# Patient Record
Sex: Female | Born: 1975
Health system: Southern US, Community
[De-identification: ages and names within clinical notes are randomized; demographics above are authoritative.]

## PROBLEM LIST (undated history)

## (undated) DIAGNOSIS — M1990 Unspecified osteoarthritis, unspecified site: Secondary | ICD-10-CM

## (undated) DIAGNOSIS — Z973 Presence of spectacles and contact lenses: Secondary | ICD-10-CM

## (undated) DIAGNOSIS — T753XXA Motion sickness, initial encounter: Secondary | ICD-10-CM

---

## 2013-04-17 ENCOUNTER — Ambulatory Visit: Payer: Self-pay

## 2013-04-22 ENCOUNTER — Encounter: Payer: Self-pay | Admitting: General Surgery

## 2013-04-22 ENCOUNTER — Ambulatory Visit (INDEPENDENT_AMBULATORY_CARE_PROVIDER_SITE_OTHER): Payer: 59 | Admitting: General Surgery

## 2013-04-22 VITALS — BP 118/70 | HR 76 | Resp 12 | Ht 67.0 in | Wt 159.0 lb

## 2013-04-22 DIAGNOSIS — N63 Unspecified lump in unspecified breast: Secondary | ICD-10-CM

## 2013-04-22 NOTE — Progress Notes (Signed)
Patient ID: Jasmine Freeman [name], female   DOB: 1975-05-15, 38 y.o.   MRN: [id number]  Chief Complaint  Patient presents with  . Other    New Pt left breast mass 12 o'clock    HPI Jasmine Freeman is a 38 y.o. female who presents for a breast evaluation. The most recent mammogram was done on 04/17/13 as well as a left breast ultrasound. Patient does perform regular self breast checks and gets regular mammograms done.  She denies any problems with the breasts at this time. The patient states she had a left breast cyst in 1999 but was left alone at that time. The patient states she went for a pre-conception visit and her physician found a left breast mass.   The patient had no difficulty nursing her first child 2.5 years ago. No episodes of mastitis.   HPI  History reviewed. No pertinent past medical history.  History reviewed. No pertinent past surgical history.  History reviewed. No pertinent family history.  Social History History  Substance Use Topics  . Smoking status: Never Smoker   . Smokeless tobacco: Never Used  . Alcohol Use: No    No Known Allergies  Current Outpatient Prescriptions  Medication Sig Dispense Refill  . Prenatal Vit-Fe Sulfate-FA (PRENATAL VITAMIN PO) Take 1 tablet by mouth daily.       No current facility-administered medications for this visit.    Review of Systems Review of Systems  Constitutional: Negative.   Respiratory: Negative.   Cardiovascular: Negative.     Blood pressure 118/70, pulse 76, resp. rate 12, height 5\' 7"  (1.702 m), weight 159 lb (72.122 kg).  Physical Exam Physical Exam  Constitutional: She is oriented to person, place, and time. She appears well-developed and well-nourished.  Neck: Neck supple. No thyromegaly present.  Cardiovascular: Normal rate, regular rhythm and normal heart sounds.   No murmur heard. Pulmonary/Chest: Effort normal and breath sounds normal. Right breast exhibits no inverted nipple,  no mass, no nipple discharge, no skin change and no tenderness. Left breast exhibits no inverted nipple, no mass, no nipple discharge, no skin change and no tenderness.    Lymphadenopathy:    She has no cervical adenopathy.    She has no axillary adenopathy.  Neurological: She is alert and oriented to person, place, and time.  Skin: Skin is warm and dry.    Data Reviewed Abdominal ultrasound dated April 17, 2013 was reviewed. 5 mm cyst in the 12:00 position. Small cluster of cysts in the 10:00 position. No cystic or solid lesions of concern. BI-RAD-2.  Assessment    Prominent breast lobule, no evidence of malignancy or dominant mass requiring biopsy. Her     Plan    Monthly self-examination was encouraged. The patient was asked to report any changes on her self-exam.        Earline MayotteByrnett, Polo Mcmartin W 04/23/2013, 8:53 PM

## 2013-04-22 NOTE — Patient Instructions (Addendum)
Patient to return as needed. Patient to continue self breast checks on a monthly basis.

## 2013-06-27 ENCOUNTER — Other Ambulatory Visit: Payer: Self-pay | Admitting: Obstetrics and Gynecology

## 2013-06-27 LAB — HCG, QUANTITATIVE, PREGNANCY: BETA HCG, QUANT.: 69559 m[IU]/mL — AB

## 2013-06-29 ENCOUNTER — Other Ambulatory Visit: Payer: Self-pay | Admitting: Obstetrics and Gynecology

## 2013-06-29 LAB — HCG, QUANTITATIVE, PREGNANCY: BETA HCG, QUANT.: 89207 m[IU]/mL — AB

## 2014-01-11 ENCOUNTER — Inpatient Hospital Stay: Payer: Self-pay | Admitting: Obstetrics and Gynecology

## 2014-01-11 LAB — CBC WITH DIFFERENTIAL/PLATELET
BASOS PCT: 0.5 %
Basophil #: 0.1 10*3/uL (ref 0.0–0.1)
EOS PCT: 0.9 %
Eosinophil #: 0.2 10*3/uL (ref 0.0–0.7)
HCT: 37.5 % (ref 35.0–47.0)
HGB: 12.7 g/dL (ref 12.0–16.0)
LYMPHS PCT: 23.2 %
Lymphocyte #: 3.9 10*3/uL — ABNORMAL HIGH (ref 1.0–3.6)
MCH: 32.6 pg (ref 26.0–34.0)
MCHC: 33.8 g/dL (ref 32.0–36.0)
MCV: 96 fL (ref 80–100)
MONOS PCT: 6.2 %
Monocyte #: 1 x10 3/mm — ABNORMAL HIGH (ref 0.2–0.9)
NEUTROS ABS: 11.7 10*3/uL — AB (ref 1.4–6.5)
Neutrophil %: 69.2 %
PLATELETS: 223 10*3/uL (ref 150–440)
RBC: 3.89 10*6/uL (ref 3.80–5.20)
RDW: 13.5 % (ref 11.5–14.5)
WBC: 16.9 10*3/uL — ABNORMAL HIGH (ref 3.6–11.0)

## 2014-01-14 LAB — BETA STREP CULTURE(ARMC)

## 2014-01-19 ENCOUNTER — Encounter: Payer: Self-pay | Admitting: General Surgery

## 2014-01-24 ENCOUNTER — Observation Stay: Payer: Self-pay | Admitting: Obstetrics and Gynecology

## 2014-01-30 ENCOUNTER — Inpatient Hospital Stay: Payer: Self-pay | Admitting: Obstetrics and Gynecology

## 2014-01-30 LAB — CBC WITH DIFFERENTIAL/PLATELET
BASOS ABS: 0 10*3/uL (ref 0.0–0.1)
Basophil %: 0.2 %
Eosinophil #: 0.1 10*3/uL (ref 0.0–0.7)
Eosinophil %: 0.7 %
HCT: 35.6 % (ref 35.0–47.0)
HGB: 12.2 g/dL (ref 12.0–16.0)
Lymphocyte #: 2.6 10*3/uL (ref 1.0–3.6)
Lymphocyte %: 17.1 %
MCH: 33.1 pg (ref 26.0–34.0)
MCHC: 34.2 g/dL (ref 32.0–36.0)
MCV: 97 fL (ref 80–100)
MONO ABS: 1.2 x10 3/mm — AB (ref 0.2–0.9)
MONOS PCT: 7.8 %
NEUTROS PCT: 74.2 %
Neutrophil #: 11.4 10*3/uL — ABNORMAL HIGH (ref 1.4–6.5)
PLATELETS: 199 10*3/uL (ref 150–440)
RBC: 3.68 10*6/uL — AB (ref 3.80–5.20)
RDW: 13.1 % (ref 11.5–14.5)
WBC: 15.4 10*3/uL — ABNORMAL HIGH (ref 3.6–11.0)

## 2014-02-01 LAB — HEMATOCRIT: HCT: 31.5 % — ABNORMAL LOW (ref 35.0–47.0)

## 2014-07-28 NOTE — H&P (Signed)
L&D Evaluation:  History:  HPI 39 yo G2P0101 at 845w0d by D=10 wk US derived EDC of 02/14/2014 presenting with contractions starting at 18:00 this afternoon, increasing intensity, as well as associated pelvic pressure. +FM, no LOF, no VB.  The patient prenatal course is noteable for a prior preterm birth at 3536 weeks.  She has been receiving weekly Mekena injections this pregnancy up to 35 weeks.   Presents with contractions   Patient's Medical History No Chronic Illness   Patient's Surgical History none   Medications Pre Natal Vitamins   Allergies PCN   Social History none   Family History Non-Contributory   ROS:  ROS All systems were reviewed.  HEENT, CNS, GI, GU, Respiratory, CV, Renal and Musculoskeletal systems were found to be normal.   Exam:  Vital Signs stable   Urine Protein not completed   General no apparent distress   Mental Status clear   Chest no increased work of breathing   Abdomen gravid, non-tender   Estimated Fetal Weight Average for gestational age   Fetal Position vtx   Back no CVAT   Edema no edema   Pelvic no external lesions, 4.5/70/-2 vtx   Mebranes Intact   FHT normal rate with no decels, 135, moderte, positive accels, no decels   Ucx regular, q314min   Skin no lesions   Lymph no lymphadenopathy   Impression:  Impression 39 yo G2P0101 at 10445w0d presenting with contractions   Plan:  Plan EFM/NST, monitor contractions and for cervical change   Comments 1) R/O term labor - 4.5/70/-2 recheck in 2-hrs  2) Fetus - category I tracing, reactive - 18lbs weight gain this pregnancy - 1-hr OGTT 139 - Pelvis tested to 7lbs 11oz  - EFW 01/19/2014 at 3453w2d 6lbs 8oz c/w 54.2%ile  3) PNL A neg / ABSC neg / RI / VZI / HBsAg neg / HIV neg / RPR NR / Negative informaseq / CF negative / 1-hr 139 at 28 weeks / GBS negatie 01/11/14 / pap 04/16/2013 neg/neg - received rhogam 11/25/2013  4) TDAP received 12/10/2013, Influenza vaccination received  01/02/2014  5) Breast feeding / undecided on contraception  6) Disposition - pending cervical recheck   Electronic Signatures: Lorrene ReidStaebler, Enna Warwick M (MD)  (Signed [phone]-Nov-15 21:42)  Authored: L&D Evaluation   Last Updated: 07-Nov-15 21:42 by Lorrene ReidStaebler, Eevee Borbon M (MD)

## 2014-07-28 NOTE — H&P (Signed)
L&D Evaluation:  History:  HPI 39 yo G2P0101 at 3564w6d by D=10 wk US derived EDC of 02/14/2014 presenting with contractions starting this evening, increasing intensity, as well as associated pelvic pressure. Last check in clinic was 4/70/-2 on 01/29/14 +FM, no LOF, no VB.  The patient prenatal course is noteable for a prior preterm birth at 2436 weeks.  She has been receiving weekly Mekena injections this pregnancy up to 35 weeks.   Presents with contractions   Patient's Medical History No Chronic Illness   Patient's Surgical History none   Medications Pre Natal Vitamins   Allergies PCN   Social History none   Family History Non-Contributory   ROS:  ROS All systems were reviewed.  HEENT, CNS, GI, GU, Respiratory, CV, Renal and Musculoskeletal systems were found to be normal.   Exam:  Vital Signs stable   Urine Protein not completed   General no apparent distress   Mental Status clear   Chest no increased work of breathing   Abdomen gravid, non-tender   Estimated Fetal Weight Average for gestational age   Fetal Position vtx   Back no CVAT   Edema no edema   Pelvic no external lesions, 6/80/-2 vtx   Mebranes Intact   FHT normal rate with no decels   Ucx regular, q404min   Skin no lesions   Lymph no lymphadenopathy   Impression:  Impression 39 yo G2P0101 at 4369w0d presenting with contractions   Plan:  Plan EFM/NST, monitor contractions and for cervical change   Comments 1) Term labor - 6cm on exam, active labor. will send labs have patient get epidural and then AROM if fails to make change     2) Fetus - category I tracing, reactive - 18lbs weight gain this pregnancy - 1-hr OGTT 139 - Pelvis tested to 7lbs 11oz  - EFW 01/19/2014 at 6160w2d 6lbs 8oz c/w 54.2%ile  3) PNL A neg / ABSC neg / RI / VZI / HBsAg neg / HIV neg / RPR NR / Negative informaseq / CF negative / 1-hr 139 at 28 weeks / GBS negatie 01/11/14 / pap 04/16/2013 neg/neg - received rhogam  11/25/2013  4) TDAP received 12/10/2013, Influenza vaccination received 01/02/2014  5) Breast feeding / undecided on contraception  6) Disposition - pending delivery anticipated TSVD   Electronic Signatures: Lorrene ReidStaebler, Dmarcus Decicco M (MD)  (Signed 13-Nov-15 18:57)  Authored: L&D Evaluation   Last Updated: 13-Nov-15 18:57 by Lorrene ReidStaebler, Jaqlyn Gruenhagen M (MD)

## 2014-07-28 NOTE — H&P (Signed)
L&D Evaluation:  History:  HPI 39 yo G2P0101 at 3921w1d by D=10 wk US derived EDC of 02/14/2014 presenting with contractions starting late this afternoon.  She was noted to be 1cm at 16:30, then made change to 2cm at 1800.  Contraction increasing in intensity, q724min.  +FM, no LOF, no VB.  The patient prenatal course is noteable for a prior preterm birth at 1236 weeks.  She has been receiving weekly Mekena injections this pregnancy.   Presents with contractions   Patient's Medical History No Chronic Illness   Patient's Surgical History none   Medications Pre Natal Vitamins   Allergies NKDA   Social History none   Family History Non-Contributory   ROS:  ROS All systems were reviewed.  HEENT, CNS, GI, GU, Respiratory, CV, Renal and Musculoskeletal systems were found to be normal.   Exam:  Vital Signs stable   Urine Protein not completed   General no apparent distress   Mental Status clear   Chest no increased work of breathing   Abdomen gravid, non-tender   Estimated Fetal Weight Average for gestational age   Fetal Position vtx suture palpated on bimanual exam   Back no CVAT   Edema no edema   Pelvic no external lesions, 3/50/-3 vtx   Mebranes Intact   FHT normal rate with no decels, 135, moderte, positive accels, no decels   Ucx regular, q614min   Skin no lesions   Lymph no lymphadenopathy   Impression:  Impression 39 yo G2P0101 at 3221w1d presenting in possible preterm labor   Plan:  Plan EFM/NST, monitor contractions and for cervical change   Comments 1) Preterm labor - history of prior 36 week delivery and cervical change in the last 3 hrs. - Terbutaline - start IV fluids - start ampicillin  2) Fetus - category I tracing, reactive - 18lbs weight gain this pregnancy - 1-hr OGTT - Pelvis tested to 7lbs 11oz   3) PNL A neg / ABSC neg / RI / VZI / HBsAg neg / HIV neg / RPR NR / Negative informaseq / CF negative / 1-hr 139 at 28 weeks / GBS unknown /  pap 04/16/2013 neg/neg - received rhogam 11/25/2013  4) TDAP received 12/10/2013, Influenza vaccination received 01/02/2014  5) Breast feeding / undecided on contraception  6) Disposition - pending arrest in in preterm labor or delivery   Electronic Signatures: Lorrene ReidStaebler, Shahzad Thomann M (MD)  (Signed 25-Oct-15 20:09)  Authored: L&D Evaluation   Last Updated: 25-Oct-15 20:09 by Lorrene ReidStaebler, Amahia Madonia M (MD)

## 2015-03-21 HISTORY — PX: BREAST BIOPSY: SHX20

## 2016-01-04 DIAGNOSIS — Z13220 Encounter for screening for lipoid disorders: Secondary | ICD-10-CM | POA: Diagnosis not present

## 2016-01-04 DIAGNOSIS — Z131 Encounter for screening for diabetes mellitus: Secondary | ICD-10-CM | POA: Diagnosis not present

## 2016-01-04 DIAGNOSIS — Z01419 Encounter for gynecological examination (general) (routine) without abnormal findings: Secondary | ICD-10-CM | POA: Diagnosis not present

## 2016-01-04 DIAGNOSIS — Z1329 Encounter for screening for other suspected endocrine disorder: Secondary | ICD-10-CM | POA: Diagnosis not present

## 2016-03-15 DIAGNOSIS — Z7189 Other specified counseling: Secondary | ICD-10-CM | POA: Insufficient documentation

## 2016-03-15 DIAGNOSIS — F418 Other specified anxiety disorders: Secondary | ICD-10-CM | POA: Insufficient documentation

## 2016-03-15 DIAGNOSIS — Z7185 Encounter for immunization safety counseling: Secondary | ICD-10-CM | POA: Insufficient documentation

## 2016-03-31 DIAGNOSIS — Z23 Encounter for immunization: Secondary | ICD-10-CM | POA: Diagnosis not present

## 2016-05-01 DIAGNOSIS — F418 Other specified anxiety disorders: Secondary | ICD-10-CM | POA: Diagnosis not present

## 2016-08-24 DIAGNOSIS — H5203 Hypermetropia, bilateral: Secondary | ICD-10-CM | POA: Diagnosis not present

## 2016-09-18 ENCOUNTER — Other Ambulatory Visit: Payer: Self-pay | Admitting: Family Medicine

## 2016-09-18 DIAGNOSIS — R1011 Right upper quadrant pain: Secondary | ICD-10-CM | POA: Diagnosis not present

## 2016-09-27 ENCOUNTER — Ambulatory Visit
Admission: RE | Admit: 2016-09-27 | Discharge: 2016-09-27 | Disposition: A | Discharge status: Home / Self Care | Payer: 59 | Source: Ambulatory Visit | Attending: Family Medicine | Admitting: Family Medicine

## 2016-09-27 DIAGNOSIS — R1011 Right upper quadrant pain: Secondary | ICD-10-CM | POA: Diagnosis not present

## 2016-09-29 ENCOUNTER — Other Ambulatory Visit: Payer: Self-pay | Admitting: Obstetrics and Gynecology

## 2016-09-29 DIAGNOSIS — R1084 Generalized abdominal pain: Secondary | ICD-10-CM

## 2016-11-26 ENCOUNTER — Other Ambulatory Visit: Payer: Self-pay | Admitting: Obstetrics and Gynecology

## 2016-11-26 MED ORDER — ONDANSETRON 8 MG PO TBDP: 8.0000 mg | ORAL_TABLET | Freq: Three times a day (TID) | ORAL | 0 refills | Status: DC | PRN

## 2016-11-26 MED ORDER — PROMETHAZINE HCL 25 MG PO TABS: 25.0000 mg | ORAL_TABLET | Freq: Four times a day (QID) | ORAL | 2 refills | Status: DC | PRN

## 2016-11-28 ENCOUNTER — Other Ambulatory Visit: Payer: Self-pay

## 2016-11-29 ENCOUNTER — Encounter: Payer: Self-pay | Admitting: Gastroenterology

## 2016-11-29 ENCOUNTER — Ambulatory Visit (INDEPENDENT_AMBULATORY_CARE_PROVIDER_SITE_OTHER): Payer: 59 | Admitting: Gastroenterology

## 2016-11-29 ENCOUNTER — Encounter (INDEPENDENT_AMBULATORY_CARE_PROVIDER_SITE_OTHER): Payer: Self-pay

## 2016-11-29 VITALS — BP 117/65 | HR 70 | Temp 99.1°F | Ht 67.0 in | Wt 164.0 lb

## 2016-11-29 DIAGNOSIS — R112 Nausea with vomiting, unspecified: Secondary | ICD-10-CM

## 2016-11-29 NOTE — Patient Instructions (Signed)
You are scheduled a HIDA scan at [facility] on Friday, Sept 28th @ 11:30am. Please arrive at 11:15am and check in at the medical mall registration desk. You cannot have anything to eat or drink 8 hours prior.   If you need to reschedule this appointment for any reason, please contact central scheduling at [phone].

## 2016-11-29 NOTE — Progress Notes (Signed)
Gastroenterology Consultation  Referring Provider:     Lindwood Coke, MD Primary Care Physician:  Jasmine Austria, MD Primary Gastroenterologist:  Dr. Servando Snare     Reason for Consultation:     Nausea        HPI:   Jasmine Freeman is a 41 y.o. y/o female referred for consultation & management of Nausea by Dr. Vena Austria, MD.  This patient comes today with a report of a few months of abdominal pain and nausea. The patient states her abdominal pain was in the mid abdomen and she reports that the pain would double her over. The patient had a daughter with a tapeworm that she believes was from her form. The patient raises takes. The patient is not having any diarrhea at the present time and states that the abdominal pain has been gone recently but she continues to have nausea. The patient reports the nausea to be worse with greasy or fatty foods. The patient has also had an elevated ALT which has not been worked up. She did not have any sign of fatty liver on her ultrasound. She also reports that the nausea was made worse with riding a tractor. She has episodes of vomiting typically once a week but has nausea chronically. There is no report of any unexplained weight loss and the patient actually reports she has gained weight. There is no diarrhea or constipation. She also reports that she has not been tried on any medication for her nausea or abdominal pain.  History reviewed. No pertinent past medical history.  History reviewed. No pertinent surgical history.  Prior to Admission medications   Medication Sig Start Date End Date Taking? Authorizing Provider  ondansetron (ZOFRAN-ODT) 8 MG disintegrating tablet Take 1 tablet (8 mg total) by mouth every 8 (eight) hours as needed for nausea. 11/26/16  Yes Jasmine Austria, MD  Prenatal Vit-Fe Sulfate-FA (PRENATAL VITAMIN PO) Take 1 tablet by mouth daily.    [provider]  promethazine (PHENERGAN) 25 MG tablet Take 1 tablet (25 mg  total) by mouth every 6 (six) hours as needed for nausea or vomiting. Patient not taking: Reported on 11/29/2016 11/26/16   Jasmine Austria, MD  tretinoin (RETIN-A) 0.025 % gel  09/04/12   [provider]    History reviewed. No pertinent family history.   Social History  Substance Use Topics  . Smoking status: Never Smoker  . Smokeless tobacco: Never Used  . Alcohol use No    Allergies as of 11/29/2016  . (No Known Allergies)    Review of Systems:    All systems reviewed and negative except where noted in HPI.   Physical Exam:  BP 117/65   Pulse 70   Temp 99.1 F (37.3 C) (Oral)   Ht  (1.702 m)   Wt 164 lb (74.4 kg)   BMI 25.69 kg/m  No LMP recorded. Psych:  Alert and cooperative. Normal mood and affect. General:   Alert,  Well-developed, well-nourished, pleasant and cooperative in NAD Head:  Normocephalic and atraumatic. Eyes:  Sclera clear, no icterus.   Conjunctiva pink. Ears:  Normal auditory acuity. Nose:  No deformity, discharge, or lesions. Mouth:  No deformity or lesions,oropharynx pink & moist. Neck:  Supple; no masses or thyromegaly. Lungs:  Respirations even and unlabored.  Clear throughout to auscultation.   No wheezes, crackles, or rhonchi. No acute distress. Heart:  Regular rate and rhythm; no murmurs, clicks, rubs, or gallops. Abdomen:  Normal bowel sounds.  No  bruits.  Soft, non-tender and non-distended without masses, hepatosplenomegaly or hernias noted.  No guarding or rebound tenderness.  Negative Carnett sign.   Rectal:  Deferred.  Msk:  Symmetrical without gross deformities.  Good, equal movement & strength bilaterally. Pulses:  Normal pulses noted. Extremities:  No clubbing or edema.  No cyanosis. Neurologic:  Alert and oriented x3;  grossly normal neurologically. Skin:  Intact without significant lesions or rashes.  No jaundice. Lymph Nodes:  No significant cervical adenopathy. Psych:  Alert and cooperative. Normal mood and  affect.  Imaging Studies: No results found.  Assessment and Plan:   Lyrik A Jean Jasmine Freeman is a 41 y.o. y/o female who comes in today with a history of solid pain that has resolved. The patient continues to have nausea. There is no report of any change in bowel habits but there is a history of a contact with her daughter who had a tapeworm. The patient reports that her symptoms are worse with greasy or fatty foods particularly poor. The patient had an ultrasound but will be set up for a gallbladder emptying study. She will also have her labs checked for possible causes of her abnormal liver enzymes. The patient has also been given samples of Dexilant to see if her nausea and vomiting is acid related. The patient will be contacted with the results of her labs. She has been explained the plan and agrees with it.  Jasmine Miniumarren Moris Ratchford, MD. Clementeen GrahamFACG   Note: This dictation was prepared with Dragon dictation along with smaller phrase technology. Any transcriptional errors that result from this process are unintentional.

## 2016-11-30 LAB — IRON AND TIBC
IRON SATURATION: 44 % (ref 15–55)
Iron: 130 ug/dL (ref 27–159)
Total Iron Binding Capacity: 297 ug/dL (ref 250–450)
UIBC: 167 ug/dL (ref 131–425)

## 2016-11-30 LAB — HEPATIC FUNCTION PANEL
ALBUMIN: 5.2 g/dL (ref 3.5–5.5)
ALT: 43 IU/L — ABNORMAL HIGH (ref 0–32)
AST: 30 IU/L (ref 0–40)
Alkaline Phosphatase: 46 IU/L (ref 39–117)
BILIRUBIN TOTAL: 0.7 mg/dL (ref 0.0–1.2)
Bilirubin, Direct: 0.16 mg/dL (ref 0.00–0.40)
TOTAL PROTEIN: 7.3 g/dL (ref 6.0–8.5)

## 2016-11-30 LAB — ANTI-SMOOTH MUSCLE ANTIBODY, IGG: SMOOTH MUSCLE AB: 9 U (ref 0–19)

## 2016-11-30 LAB — ANA: ANA: NEGATIVE

## 2016-11-30 LAB — ALPHA-1-ANTITRYPSIN: A1 ANTITRYPSIN: 140 mg/dL (ref 90–200)

## 2016-11-30 LAB — HEPATITIS B SURFACE ANTIGEN: Hepatitis B Surface Ag: NEGATIVE

## 2016-11-30 LAB — MITOCHONDRIAL ANTIBODIES: MITOCHONDRIAL AB: 3.7 U (ref 0.0–20.0)

## 2016-11-30 LAB — HEPATITIS A ANTIBODY, TOTAL: HEP A TOTAL AB: NEGATIVE

## 2016-11-30 LAB — HEPATITIS B SURFACE ANTIBODY,QUALITATIVE: Hep B Surface Ab, Qual: REACTIVE

## 2016-11-30 LAB — CERULOPLASMIN: Ceruloplasmin: 22 mg/dL (ref 19.0–39.0)

## 2016-11-30 LAB — FERRITIN: Ferritin: 97 ng/mL (ref 15–150)

## 2016-12-01 ENCOUNTER — Other Ambulatory Visit: Payer: Self-pay

## 2016-12-01 ENCOUNTER — Telehealth: Payer: Self-pay

## 2016-12-01 NOTE — Telephone Encounter (Signed)
-----   Message from Midge Minium, MD sent at 12/01/2016 10:10 AM EDT ----- Left this patient know that her liver tests were all negative except the continued increased ALT.  Please add a GGT to this patient's blood work.

## 2016-12-01 NOTE — Telephone Encounter (Signed)
Left vm with lab results and Dr. Annabell Sabal request for the additional GGT lab. Will contact pt once we receive result of this lab.

## 2016-12-04 ENCOUNTER — Other Ambulatory Visit: Payer: Self-pay

## 2016-12-04 LAB — GAMMA GT: GGT: 70 IU/L — AB (ref 0–60)

## 2016-12-04 LAB — SPECIMEN STATUS REPORT

## 2016-12-06 ENCOUNTER — Telehealth: Payer: Self-pay

## 2016-12-06 NOTE — Telephone Encounter (Signed)
-----   Message from Midge Minium, MD sent at 12/06/2016 10:24 AM EDT ----- With the patient know that her GGT was slightly high indicating that her increased ALT is likely from her liver.  All the other labs do not show any worrisome features of her liver.  We are still waiting for the stool samples. Her blood tests also showed that she was immune to hepatitis the but not to hepatitis A And she may want to get a vaccination for hepatitis A.

## 2016-12-06 NOTE — Telephone Encounter (Signed)
LVM for pt to return my call.

## 2016-12-07 DIAGNOSIS — R112 Nausea with vomiting, unspecified: Secondary | ICD-10-CM | POA: Diagnosis not present

## 2016-12-07 NOTE — Telephone Encounter (Signed)
LVM again for pt to return my call.  

## 2016-12-07 NOTE — Telephone Encounter (Signed)
Pt returned my call and we discussed results of her labs.

## 2016-12-08 ENCOUNTER — Encounter: Payer: Self-pay | Admitting: Emergency Medicine

## 2016-12-08 ENCOUNTER — Emergency Department: Payer: 59

## 2016-12-08 ENCOUNTER — Emergency Department
Admission: EM | Admit: 2016-12-08 | Discharge: 2016-12-08 | Disposition: A | Discharge status: Home / Self Care | Payer: 59 | Attending: Emergency Medicine | Admitting: Emergency Medicine

## 2016-12-08 DIAGNOSIS — R42 Dizziness and giddiness: Secondary | ICD-10-CM | POA: Insufficient documentation

## 2016-12-08 DIAGNOSIS — R1011 Right upper quadrant pain: Secondary | ICD-10-CM | POA: Diagnosis not present

## 2016-12-08 DIAGNOSIS — N83299 Other ovarian cyst, unspecified side: Secondary | ICD-10-CM | POA: Insufficient documentation

## 2016-12-08 DIAGNOSIS — Z79899 Other long term (current) drug therapy: Secondary | ICD-10-CM | POA: Insufficient documentation

## 2016-12-08 DIAGNOSIS — R109 Unspecified abdominal pain: Secondary | ICD-10-CM | POA: Diagnosis not present

## 2016-12-08 DIAGNOSIS — N83209 Unspecified ovarian cyst, unspecified side: Secondary | ICD-10-CM | POA: Diagnosis not present

## 2016-12-08 DIAGNOSIS — K5900 Constipation, unspecified: Secondary | ICD-10-CM | POA: Diagnosis not present

## 2016-12-08 DIAGNOSIS — H9319 Tinnitus, unspecified ear: Secondary | ICD-10-CM | POA: Insufficient documentation

## 2016-12-08 DIAGNOSIS — R1013 Epigastric pain: Secondary | ICD-10-CM | POA: Insufficient documentation

## 2016-12-08 DIAGNOSIS — Z975 Presence of (intrauterine) contraceptive device: Secondary | ICD-10-CM | POA: Insufficient documentation

## 2016-12-08 LAB — COMPREHENSIVE METABOLIC PANEL
ALBUMIN: 4.6 g/dL (ref 3.5–5.0)
ALK PHOS: 38 U/L (ref 38–126)
ALT: 40 U/L (ref 14–54)
ANION GAP: 9 (ref 5–15)
AST: 39 U/L (ref 15–41)
BILIRUBIN TOTAL: 1 mg/dL (ref 0.3–1.2)
BUN: 11 mg/dL (ref 6–20)
CO2: 23 mmol/L (ref 22–32)
Calcium: 9.3 mg/dL (ref 8.9–10.3)
Chloride: 106 mmol/L (ref 101–111)
Creatinine, Ser: 0.58 mg/dL (ref 0.44–1.00)
GFR calc Af Amer: >60 mL/min (ref >60)
GFR calc non Af Amer: >60 mL/min (ref >60)
GLUCOSE: 103 mg/dL — AB (ref 65–99)
POTASSIUM: 3.8 mmol/L (ref 3.5–5.1)
SODIUM: 138 mmol/L (ref 135–145)
TOTAL PROTEIN: 7.2 g/dL (ref 6.5–8.1)

## 2016-12-08 LAB — URINALYSIS, COMPLETE (UACMP) WITH MICROSCOPIC
Bilirubin Urine: NEGATIVE
Glucose, UA: NEGATIVE mg/dL
Ketones, ur: NEGATIVE mg/dL
Leukocytes, UA: NEGATIVE
Nitrite: NEGATIVE
PROTEIN: NEGATIVE mg/dL
SPECIFIC GRAVITY, URINE: 1.009 (ref 1.005–1.030)
pH: 7 (ref 5.0–8.0)

## 2016-12-08 LAB — CBC
HEMATOCRIT: 37.4 % (ref 35.0–47.0)
HEMOGLOBIN: 13.1 g/dL (ref 12.0–16.0)
MCH: 32.6 pg (ref 26.0–34.0)
MCHC: 35.1 g/dL (ref 32.0–36.0)
MCV: 93 fL (ref 80.0–100.0)
Platelets: 261 10*3/uL (ref 150–440)
RBC: 4.02 MIL/uL (ref 3.80–5.20)
RDW: 12.4 % (ref 11.5–14.5)
WBC: 10.1 10*3/uL (ref 3.6–11.0)

## 2016-12-08 LAB — HCG, QUANTITATIVE, PREGNANCY

## 2016-12-08 LAB — POCT PREGNANCY, URINE: PREG TEST UR: NEGATIVE

## 2016-12-08 LAB — LIPASE, BLOOD: Lipase: 27 U/L (ref 11–51)

## 2016-12-08 MED ORDER — DOCUSATE SODIUM 100 MG PO CAPS: 100.0000 mg | ORAL_CAPSULE | Freq: Every day | ORAL | 2 refills | Status: AC | PRN

## 2016-12-08 MED ORDER — IOPAMIDOL (ISOVUE-300) INJECTION 61%
100.0000 mL | Freq: Once | INTRAVENOUS | Status: AC | PRN
  Administered 2016-12-08: 100 mL via INTRAVENOUS

## 2016-12-08 MED ORDER — IOPAMIDOL (ISOVUE-300) INJECTION 61%
30.0000 mL | Freq: Once | INTRAVENOUS | Status: AC | PRN
Start: 2016-12-08 — End: 2016-12-08
  Administered 2016-12-08: 30 mL via ORAL

## 2016-12-08 MED ORDER — SODIUM CHLORIDE 0.9 % IV BOLUS (SEPSIS)
1000.0000 mL | Freq: Once | INTRAVENOUS | Status: AC
  Administered 2016-12-08: 1000 mL via INTRAVENOUS

## 2016-12-08 MED ORDER — POLYETHYLENE GLYCOL 3350 17 G PO PACK: 17.0000 g | PACK | Freq: Every day | ORAL | 0 refills | Status: DC

## 2016-12-08 NOTE — ED Triage Notes (Addendum)
Patient presents to the ED with severe left upper quadrant abdominal pain that has been intermittent x several months but patient states pain has never been as severe as it has been today.  Patient reports dizziness for the past several days and ringing in her ears.  Patient reports pain is slightly improved with pressure to the area.  Patient reports intermittent nausea but denies vomiting and diarrhea.  Patient states she has seen Dr. Smith Mince, the GI doctor and has been told her ALTs are elevated and her liver is inflamed.  Patient states pain has improved slightly at this time from how it was approx. 1 hour ago.  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

## 2016-12-08 NOTE — ED Notes (Signed)
Patient transported to CT 

## 2016-12-08 NOTE — ED Notes (Signed)
Pt reports left upper quad abd pain for several months with worsening pain today.  No fever. No back pain. Pt states no pain now.  No n/v/d  Pt drinking po contrast. Dr Jean Rosenthal with pt.  nsr on monitor.  Iv fluids infusing.  Pt unable to void at this time.

## 2016-12-08 NOTE — ED Notes (Signed)
Pt to xray

## 2016-12-08 NOTE — ED Provider Notes (Signed)
[facility] Emergency Department Provider Note  ____________________________________________   I have reviewed the triage vital signs and the nursing notes.   HISTORY  Chief Complaint Abdominal Pain and Dizziness    HPI Jasmine Freeman is a 41 y.o. female who presents today with right upper quadrant abdominal pain that was sharp, intense, crampy and lasted for about an hour or so after eating. Patient has had recurrent episodes of epigastric and right upper quadrant and left upper quadrant abdominal pain off and on for 6 months. She is being followed by a GI specialist for this. Her daughter did have ringworm and she has had numerous tick bites in the past and she is being worked up for worse with over and parasite and she dropped to the stool off this morning, she also is being worked up for tick mediated meats allergy as she has been unable to eat pork and other meat for the last several months because it seems to make her abdominal pain worse. The pain is crampy, food related, transitory. She is not had any vomiting or fever. The patient states that she has been not lost weight, she has normal bowel movements and she has been eating normally aside from a inability to take me. Fatty food also makes it worse. Patient has had a negative outpatient ultrasound and reassuring blood work done with one elevated transaminase that was only slightly elevated in July. Patient has had a extensive outpatient workup and isn't currently working with GI but as the pain lasted longer and seemed more intense today than normal she came in for further evaluation. Patient did have a last bowel movement this morning. In addition, patient states that she has had occasional "dizziness" which Tums sometimes seem to be vertiginous she is not suffering from an enema to have some earlier in his been going off and on for 6 months. Sometimes she has ringing in her left ear. That is not present at this  time, however that is also been a symptom that she has been having. Her primary care doctor is aware and working no symptoms up. She is not here for evaluation of those things she is here because of her abdominal pain. Patient also has an IUD, so she is unsure of her pregnancy status but she did last checked a pregnancy test to weeks ago and it was negative. Patient's husband is well known to [place] as one of our OB/GYN's and he is at bedside. Patient consents to have her husband present for history physical and exam Her pain is food related, she has no exertional symptoms or focal weakness or numbness. At the time of her arrival to the room she is pain-free. It is also noted that the patient's daughter had ascaris   History reviewed. No pertinent past medical history.  Patient Active Problem List   Diagnosis Date Noted  . Anxiety with depression 03/15/2016  . Vaccine counseling 03/15/2016  . Breast mass 04/22/2013    History reviewed. No pertinent surgical history.  Prior to Admission medications   Medication Sig Start Date End Date Taking? Authorizing Provider  ondansetron (ZOFRAN-ODT) 8 MG disintegrating tablet Take 1 tablet (8 mg total) by mouth every 8 (eight) hours as needed for nausea. 11/26/16   Vena Austria, MD  Prenatal Vit-Fe Sulfate-FA (PRENATAL VITAMIN PO) Take 1 tablet by mouth daily.    [provider]  promethazine (PHENERGAN) 25 MG tablet Take 1 tablet (25 mg total) by mouth every 6 (six)  hours as needed for nausea or vomiting. Patient not taking: Reported on 11/29/2016 11/26/16   Vena Austria, MD  tretinoin (RETIN-A) 0.025 % gel  09/04/12   [provider]    Allergies Patient has no known allergies.  No family history on file.  Social History Social History  Substance Use Topics  . Smoking status: Never Smoker  . Smokeless tobacco: Never Used  . Alcohol use No    Review of Systems Constitutional: No fever/chills Eyes: No visual  changes. ENT: No sore throat. No stiff neck no neck pain Cardiovascular: Denies chest pain. Respiratory: Denies shortness of breath. Gastrointestinal:   no vomiting.  No diarrhea.  No constipation. Genitourinary: Negative for dysuria. Musculoskeletal: Negative lower extremity swelling Skin: Negative for rash. Neurological: Negative for severe headaches, focal weakness or numbness.   ____________________________________________   PHYSICAL EXAM:  VITAL SIGNS: ED Triage Vitals [12/08/16 1619]  Enc Vitals Group     BP 136/76     Pulse Rate 80     Resp 18     Temp 98.1 F (36.7 C)     Temp Source Oral     SpO2 100 %     Weight 162 lb (73.5 kg)     Height  (1.702 m)     Head Circumference      Peak Flow      Pain Score      Pain Loc      Pain Edu?      Excl. in GC?     Constitutional: Alert and oriented. Well appearing and in no acute distress. Eyes: Conjunctivae are normal Head: Atraumatic HEENT: No congestion/rhinnorhea. Mucous membranes are moist.  Oropharynx non-erythematousTMs normal on the right, slightly occluded by cerumen on the left with normal TM visualized Neck:   Nontender with no meningismus, no masses, no stridor Cardiovascular: Normal rate, regular rhythm. Grossly normal heart sounds.  Good peripheral circulation. Respiratory: Normal respiratory effort.  No retractions. Lungs CTAB. Abdominal: Soft and nontender. No distention. No guarding no rebound Back:  There is no focal tenderness or step off.  there is no midline tenderness there are no lesions noted. there is no CVA tenderness Musculoskeletal: No lower extremity tenderness, no upper extremity tenderness. No joint effusions, no DVT signs strong distal pulses no edema Neurologic:  Cranial nerves II through XII are grossly intact 5 out of 5 strength bilateral upper and lower extremity. Finger to nose within normal limits heel to shin within normal limits, speech is normal with no word finding difficulty  or dysarthria, reflexes symmetric, pupils are equally round and reactive to light, there is no pronator drift, sensation is normal, vision is intact to confrontation, gait is deferred, there is no nystagmus, normal neurologic exam Skin:  Skin is warm, dry and intact. No rash noted. Psychiatric: Mood and affect are normal. Speech and behavior are normal.  ____________________________________________   LABS (all labs ordered are listed, but only abnormal results are displayed)  Labs Reviewed  COMPREHENSIVE METABOLIC PANEL - Abnormal; Notable for the following:       Result Value   Glucose, Bld 103 (*)    All other components within normal limits  URINALYSIS, COMPLETE (UACMP) WITH MICROSCOPIC - Abnormal; Notable for the following:    Color, Urine YELLOW (*)    APPearance CLEAR (*)    Hgb urine dipstick SMALL (*)    Bacteria, UA RARE (*)    Squamous Epithelial / LPF 0-5 (*)    All other components  within normal limits  LIPASE, BLOOD  CBC  HCG, QUANTITATIVE, PREGNANCY  POCT PREGNANCY, URINE    Pertinent labs  results that were available during my care of the patient were reviewed by me and considered in my medical decision making (see chart for details). ____________________________________________  EKG  I personally interpreted any EKGs ordered by me or triage Sinus rhythm rate 70 bpm no acute ST elevation or acute ST depression normal axis unremarkable EKG ____________________________________________  RADIOLOGY  Pertinent labs & imaging results that were available during my care of the patient were reviewed by me and considered in my medical decision making (see chart for details). If possible, patient and/or family made aware of any abnormal findings. ____________________________________________    PROCEDURES  Procedure(s) performed: None  Procedures  Critical Care performed: None  ____________________________________________   INITIAL IMPRESSION / ASSESSMENT AND  PLAN / ED COURSE  Pertinent labs & imaging results that were available during my care of the patient were reviewed by me and considered in my medical decision making (see chart for details).  Patient here with chronic recurrent abdominal pain pain-free at this time benign abdomen, I did offer her a CT scan to rule out oncologic or other pathologies, that is reassuring. There are trace ovarian cyst, we did discuss with her husband, the OB, and they do not feel that they would like to have an ultrasound performed today. Very low suspicion for torsion. Patient has no tenderness whatsoever in her abdomen and she had left upper quadrant abdominal discomfort which was sharp, associated with food, and has been recurrent now for months. The rest of her workup is entirely reassuring. I have no evidence to support any acute abdominal process nor is there evidence to suggest this is referred intrathoracic pathology such as ACS PE or dissection. In addition, patient has had some somewhat poorly described "dizziness" off and on. She has normal neurologic exam, and this is been going on for several months with no active symptoms at this time, I don't think that requires up acute further assessment in the emergency department patient and family agree. They're very eager to go home. I do notice the patient has a slow just overnight on the right than the left I did call radiology they see no evidence of a paracervical infection or worm burden. I do think the crampy abdominal pain might be related however to what is clearly a constipation issue to my read of the CT on the right side and we will start her on medication to see if that will be relieved and she will follow closely both with ENT for her recurrent ear symptoms and with her already aware and practicing GI doctor    ____________________________________________   FINAL CLINICAL IMPRESSION(S) / ED DIAGNOSES  Final diagnoses:  None      This chart was  dictated using voice recognition software.  Despite best efforts to proofread,  errors can occur which can change meaning.      Jeanmarie Plant, MD 12/08/16 [phone]

## 2016-12-08 NOTE — ED Notes (Signed)
ED Provider at bedside. 

## 2016-12-15 ENCOUNTER — Ambulatory Visit
Admission: RE | Admit: 2016-12-15 | Discharge: 2016-12-15 | Disposition: A | Discharge status: Home / Self Care | Payer: 59 | Source: Ambulatory Visit | Attending: Gastroenterology | Admitting: Gastroenterology

## 2016-12-15 DIAGNOSIS — R1011 Right upper quadrant pain: Secondary | ICD-10-CM | POA: Diagnosis not present

## 2016-12-15 DIAGNOSIS — R112 Nausea with vomiting, unspecified: Secondary | ICD-10-CM | POA: Insufficient documentation

## 2016-12-15 MED ORDER — TECHNETIUM TC 99M MEBROFENIN IV KIT
5.0000 | PACK | Freq: Once | INTRAVENOUS | Status: AC | PRN
  Administered 2016-12-15: 5.06 via INTRAVENOUS

## 2016-12-17 LAB — OVA AND PARASITE EXAMINATION

## 2016-12-21 ENCOUNTER — Telehealth: Payer: Self-pay

## 2016-12-21 NOTE — Telephone Encounter (Signed)
-----   Message from Midge Minium, MD sent at 12/17/2016  9:13 AM EDT ----- That the patient know that her gallbladder was emptying at 50% which is above the 33% normal ejection fraction.  Please find out if her nausea and other GI symptoms have improved.

## 2016-12-21 NOTE — Telephone Encounter (Signed)
Pt notified of HIDA scan. She is still experiencing some nausea and GI issues. She wanted your thoughts on if its possible she could be allergic to beef and pork. She works on a farm and always gets ticks. She only has these issues when she eats those meats. She wanted your thoughts on the Alpha Gal.

## 2016-12-25 ENCOUNTER — Other Ambulatory Visit: Payer: Self-pay

## 2016-12-25 DIAGNOSIS — R112 Nausea with vomiting, unspecified: Secondary | ICD-10-CM

## 2016-12-25 NOTE — Telephone Encounter (Signed)
Let the patient know that all the other tests were negative and please send her the tests for alpha gal.

## 2016-12-25 NOTE — Telephone Encounter (Signed)
Pt notified of stool results and HIDA scan. Alpha Gal lab ordered.

## 2017-01-11 ENCOUNTER — Ambulatory Visit: Payer: Self-pay

## 2017-01-16 DIAGNOSIS — Z23 Encounter for immunization: Secondary | ICD-10-CM | POA: Diagnosis not present

## 2017-03-11 ENCOUNTER — Other Ambulatory Visit: Payer: Self-pay | Admitting: Obstetrics and Gynecology

## 2017-03-11 MED ORDER — TRETINOIN 0.025 % EX GEL: Freq: Every day | CUTANEOUS | 3 refills | Status: DC

## 2017-03-21 ENCOUNTER — Other Ambulatory Visit: Payer: Self-pay | Admitting: Obstetrics and Gynecology

## 2017-06-13 DIAGNOSIS — H903 Sensorineural hearing loss, bilateral: Secondary | ICD-10-CM | POA: Diagnosis not present

## 2017-06-13 DIAGNOSIS — H6123 Impacted cerumen, bilateral: Secondary | ICD-10-CM | POA: Diagnosis not present

## 2017-06-13 DIAGNOSIS — H9319 Tinnitus, unspecified ear: Secondary | ICD-10-CM | POA: Diagnosis not present

## 2017-09-13 ENCOUNTER — Ambulatory Visit: Payer: Self-pay | Admitting: Obstetrics and Gynecology

## 2017-09-14 ENCOUNTER — Encounter: Payer: Self-pay | Admitting: Obstetrics and Gynecology

## 2017-09-14 ENCOUNTER — Ambulatory Visit (INDEPENDENT_AMBULATORY_CARE_PROVIDER_SITE_OTHER): Payer: 59 | Admitting: Obstetrics and Gynecology

## 2017-09-14 VITALS — BP 110/76 | HR 66 | Ht 67.0 in | Wt 163.0 lb

## 2017-09-14 DIAGNOSIS — Z30431 Encounter for routine checking of intrauterine contraceptive device: Secondary | ICD-10-CM

## 2017-09-14 DIAGNOSIS — Z1231 Encounter for screening mammogram for malignant neoplasm of breast: Secondary | ICD-10-CM

## 2017-09-14 DIAGNOSIS — Z01419 Encounter for gynecological examination (general) (routine) without abnormal findings: Secondary | ICD-10-CM

## 2017-09-14 DIAGNOSIS — Z1239 Encounter for other screening for malignant neoplasm of breast: Secondary | ICD-10-CM

## 2017-09-14 NOTE — Progress Notes (Signed)
Gynecology Annual Exam  PCP: Vena AustriaStaebler, Leatha Rohner, MD  Chief Complaint: No chief complaint on file.   History of Present Illness: Patient is a 42 y.o. G1P1 presents for annual exam. The patient has no complaints today.   LMP: No LMP recorded. (Menstrual status: IUD).  The patient is sexually active. She currently uses IUD for contraception. She denies dyspareunia.  The patient does perform self breast exams.  There is no notable family history of breast or ovarian cancer in her family.  The patient wears seatbelts: yes.   The patient has regular exercise: not asked.    The patient denies current symptoms of depression.    Review of Systems: ROS  Past Medical History:  No past medical history on file.  Past Surgical History:  No past surgical history on file.  Gynecologic History:  No LMP recorded. (Menstrual status: IUD). Contraception:03/30/2014 IUD Last Pap: Results were: 01/04/2016 NIL and HR HPV negative  Last mammogram: 04/17/2013  Results were: BI-RAD II  Obstetric History: G1P1  Family History:  No family history on file.  Social History:  Social History   Socioeconomic History  . Marital status: Married    Spouse name: Not on file  . Number of children: Not on file  . Years of education: Not on file  . Highest education level: Not on file  Occupational History  . Not on file  Social Needs  . Financial resource strain: Not on file  . Food insecurity:    Worry: Not on file    Inability: Not on file  . Transportation needs:    Medical: Not on file    Non-medical: Not on file  Tobacco Use  . Smoking status: Never Smoker  . Smokeless tobacco: Never Used  Substance and Sexual Activity  . Alcohol use: No  . Drug use: No  . Sexual activity: Not on file  Lifestyle  . Physical activity:    Days per week: Not on file    Minutes per session: Not on file  . Stress: Not on file  Relationships  . Social connections:    Talks on phone: Not on file    Gets  together: Not on file    Attends religious service: Not on file    Active member of club or organization: Not on file    Attends meetings of clubs or organizations: Not on file    Relationship status: Not on file  . Intimate partner violence:    Fear of current or ex partner: Not on file    Emotionally abused: Not on file    Physically abused: Not on file    Forced sexual activity: Not on file  Other Topics Concern  . Not on file  Social History Narrative  . Not on file    Allergies:  No Known Allergies  Medications: Prior to Admission medications   Medication Sig Start Date End Date Taking? Authorizing Provider  docusate sodium (COLACE) 100 MG capsule Take 1 capsule (100 mg total) by mouth daily as needed. 12/08/16 12/08/17  Jeanmarie PlantMcShane, James A, MD  ondansetron (ZOFRAN-ODT) 8 MG disintegrating tablet Take 1 tablet (8 mg total) by mouth every 8 (eight) hours as needed for nausea. 11/26/16   Vena AustriaStaebler, Marianny Goris, MD  polyethylene glycol [facility](MIRALAX) packet Take 17 g by mouth daily. 12/08/16   Jeanmarie PlantMcShane, James A, MD  Prenatal Vit-Fe Sulfate-FA (PRENATAL VITAMIN PO) Take 1 tablet by mouth daily.    [provider]  promethazine (PHENERGAN) 25 MG  tablet Take 1 tablet (25 mg total) by mouth every 6 (six) hours as needed for nausea or vomiting. Patient not taking: Reported on 11/29/2016 11/26/16   Vena Austria, MD  tretinoin (RETIN-A) 0.025 % gel Apply topically at bedtime. 03/11/17   Vena Austria, MD    Physical Exam Vitals: Blood pressure 110/76, pulse 66, height 5\' 7"  (1.702 m), weight 163 lb (73.9 kg), SpO2 99 %.   General: NAD HEENT: normocephalic, anicteric Thyroid: no enlargement, no palpable nodules Pulmonary: No increased work of breathing, CTAB Cardiovascular: RRR, distal pulses 2+ Breast: Breast symmetrical, no tenderness, no palpable nodules or masses, no skin or nipple retraction present, no nipple discharge.  No axillary or supraclavicular lymphadenopathy. Abdomen:  NABS, soft, non-tender, non-distended.  Umbilicus without lesions.  No hepatomegaly, splenomegaly or masses palpable. No evidence of hernia  Genitourinary:  External: Normal external female genitalia.  Normal urethral meatus, normal Bartholin's and Skene's glands.    Vagina: Normal vaginal mucosa, no evidence of prolapse.    Cervix: Grossly normal in appearance, no bleeding. IUD strings visualized 3cm  Uterus: Non-enlarged, mobile, normal contour.  No CMT  Adnexa: ovaries non-enlarged, no adnexal masses  Rectal: deferred  Lymphatic: no evidence of inguinal lymphadenopathy Extremities: no edema, erythema, or tenderness Neurologic: Grossly intact Psychiatric: mood appropriate, affect full  Female chaperone present for pelvic and breast  portions of the physical exam    Assessment: 42 y.o. G1P1 routine annual exam  Plan: Problem List Items Addressed This Visit    None    Visit Diagnoses    Encounter for gynecological examination without abnormal finding    -  Primary   Breast screening          1) Mammogram - recommend yearly screening mammogram.  Mammogram Was ordered today   2) STI screening  was notoffered and therefore not obtained  3) ASCCP guidelines and rational discussed.  Patient opts for every 3 years screening interval  4) Contraception - the patient is currently using  IUD.  She is happy with her current form of contraception and plans to continue  5) Colonoscopy -- Screening recommended starting at age 42  6) Routine healthcare maintenance including cholesterol, diabetes screening discussed Declines.  Recently checked for life insurance  7) No follow-ups on file.   Vena Austria, MD, [facility], [facility] 09/14/2017, 9:21 AM

## 2018-03-27 DIAGNOSIS — Z23 Encounter for immunization: Secondary | ICD-10-CM | POA: Diagnosis not present

## 2018-08-27 ENCOUNTER — Other Ambulatory Visit: Payer: Self-pay | Admitting: Obstetrics and Gynecology

## 2018-08-27 MED ORDER — CYCLOBENZAPRINE HCL 10 MG PO TABS: 10.0000 mg | ORAL_TABLET | Freq: Three times a day (TID) | ORAL | 0 refills | Status: AC | PRN

## 2018-08-30 ENCOUNTER — Other Ambulatory Visit: Payer: Self-pay | Admitting: Otolaryngology

## 2018-08-30 DIAGNOSIS — R42 Dizziness and giddiness: Secondary | ICD-10-CM | POA: Diagnosis not present

## 2018-08-30 DIAGNOSIS — R490 Dysphonia: Secondary | ICD-10-CM | POA: Diagnosis not present

## 2018-08-30 DIAGNOSIS — H93299 Other abnormal auditory perceptions, unspecified ear: Secondary | ICD-10-CM | POA: Diagnosis not present

## 2018-08-30 DIAGNOSIS — R070 Pain in throat: Secondary | ICD-10-CM

## 2018-09-04 ENCOUNTER — Other Ambulatory Visit: Payer: Self-pay

## 2018-09-04 ENCOUNTER — Ambulatory Visit
Admission: RE | Admit: 2018-09-04 | Discharge: 2018-09-04 | Disposition: A | Discharge status: Home / Self Care | Payer: 59 | Source: Ambulatory Visit | Attending: Otolaryngology | Admitting: Otolaryngology

## 2018-09-04 DIAGNOSIS — R6884 Jaw pain: Secondary | ICD-10-CM | POA: Diagnosis not present

## 2018-09-04 DIAGNOSIS — R070 Pain in throat: Secondary | ICD-10-CM | POA: Insufficient documentation

## 2018-09-04 DIAGNOSIS — M542 Cervicalgia: Secondary | ICD-10-CM | POA: Diagnosis not present

## 2018-09-04 MED ORDER — IOHEXOL 300 MG/ML  SOLN
75.0000 mL | Freq: Once | INTRAMUSCULAR | Status: AC | PRN
  Administered 2018-09-04: 75 mL via INTRAVENOUS

## 2019-01-20 ENCOUNTER — Other Ambulatory Visit: Payer: Self-pay | Admitting: Obstetrics and Gynecology

## 2019-01-20 ENCOUNTER — Telehealth: Payer: Self-pay | Admitting: Obstetrics and Gynecology

## 2019-01-20 DIAGNOSIS — Z23 Encounter for immunization: Secondary | ICD-10-CM | POA: Diagnosis not present

## 2019-01-20 MED ORDER — TRETINOIN 0.025 % EX GEL: Freq: Every day | CUTANEOUS | 3 refills | Status: AC

## 2019-01-20 NOTE — Telephone Encounter (Signed)
Patient scheduled 12/4 for mirena replacement with AMS at [facility].

## 2019-01-20 NOTE — Telephone Encounter (Signed)
Noted. Will order to arrive by apt date/time. 

## 2019-01-21 ENCOUNTER — Telehealth: Payer: Self-pay

## 2019-01-21 NOTE — Telephone Encounter (Signed)
PA request has been received via fax from [facility] on [address]. Pt uses this for [place]. I will start the PA today. Pt is aware.

## 2019-01-24 ENCOUNTER — Telehealth: Payer: Self-pay

## 2019-01-24 ENCOUNTER — Other Ambulatory Visit: Payer: Self-pay | Admitting: Obstetrics and Gynecology

## 2019-01-24 NOTE — Telephone Encounter (Signed)
Should be plain tretinoin 0.025%

## 2019-01-24 NOTE — Telephone Encounter (Signed)
Prior authorization for Clindamycin/Tretinoin 1.2-0.025% was denied by insurance. The alternative they will cover is Clindamycin 1% gel.   Please consider prescribing this for pt.

## 2019-02-19 NOTE — Telephone Encounter (Signed)
Patient reschedule to 03/27/18 with AMS

## 2019-02-21 ENCOUNTER — Ambulatory Visit: Payer: 59 | Admitting: Obstetrics and Gynecology

## 2019-02-24 NOTE — Telephone Encounter (Signed)
Noted. Mirena placed back in stock. Will reserve again closer to apt date/time.

## 2019-03-28 ENCOUNTER — Ambulatory Visit: Payer: 59 | Admitting: Obstetrics and Gynecology

## 2019-03-28 NOTE — Telephone Encounter (Signed)
Per pt will call back to reschedule at a later time

## 2019-06-10 DIAGNOSIS — Z20828 Contact with and (suspected) exposure to other viral communicable diseases: Secondary | ICD-10-CM | POA: Diagnosis not present

## 2019-06-11 DIAGNOSIS — M2550 Pain in unspecified joint: Secondary | ICD-10-CM | POA: Diagnosis not present

## 2019-08-08 ENCOUNTER — Other Ambulatory Visit (HOSPITAL_COMMUNITY)
Admission: RE | Admit: 2019-08-08 | Discharge: 2019-08-08 | Disposition: A | Discharge status: Home / Self Care | Payer: 59 | Source: Ambulatory Visit | Attending: Obstetrics and Gynecology | Admitting: Obstetrics and Gynecology

## 2019-08-08 ENCOUNTER — Other Ambulatory Visit: Payer: Self-pay

## 2019-08-08 ENCOUNTER — Encounter: Payer: Self-pay | Admitting: Obstetrics and Gynecology

## 2019-08-08 ENCOUNTER — Ambulatory Visit (INDEPENDENT_AMBULATORY_CARE_PROVIDER_SITE_OTHER): Payer: 59 | Admitting: Obstetrics and Gynecology

## 2019-08-08 VITALS — BP 130/80 | HR 81 | Ht 67.0 in | Wt 163.0 lb

## 2019-08-08 DIAGNOSIS — Z01419 Encounter for gynecological examination (general) (routine) without abnormal findings: Secondary | ICD-10-CM

## 2019-08-08 DIAGNOSIS — Z30431 Encounter for routine checking of intrauterine contraceptive device: Secondary | ICD-10-CM | POA: Diagnosis not present

## 2019-08-08 DIAGNOSIS — Z124 Encounter for screening for malignant neoplasm of cervix: Secondary | ICD-10-CM

## 2019-08-08 DIAGNOSIS — Z1239 Encounter for other screening for malignant neoplasm of breast: Secondary | ICD-10-CM

## 2019-08-08 NOTE — Patient Instructions (Signed)
[facility] [address] [place] [address]  [facility] [place]  [address] [place] [address]  Phone: [phone]  

## 2019-08-08 NOTE — Progress Notes (Signed)
Gynecology Annual Exam  PCP: Vena Austria, MD  Chief Complaint:  Chief Complaint  Patient presents with  . Gynecologic Exam    History of Present Illness: Patient is a 44 y.o. G2P2002 presents for annual exam. The patient has no complaints today.   LMP: No LMP recorded. (Menstrual status: IUD). Amenorrhea on Mirena IUD   The patient is sexually active. She currently uses IUD for contraception. She denies dyspareunia.  The patient does perform self breast exams.  There is no notable family history of breast or ovarian cancer in her family.  The patient wears seatbelts: yes.   The patient has regular exercise: not asked.    The patient denies current symptoms of depression.    Review of Systems: Review of Systems  Constitutional: Negative for chills and fever.  HENT: Negative for congestion.   Respiratory: Negative for cough and shortness of breath.   Cardiovascular: Negative for chest pain and palpitations.  Gastrointestinal: Negative for abdominal pain, constipation, diarrhea, heartburn, nausea and vomiting.  Genitourinary: Negative for dysuria, frequency and urgency.  Skin: Negative for itching and rash.  Neurological: Negative for dizziness and headaches.  Endo/Heme/Allergies: Negative for polydipsia.  Psychiatric/Behavioral: Negative for depression.    Past Medical History:  Patient Active Problem List   Diagnosis Date Noted  . Anxiety with depression 03/15/2016  . Vaccine counseling 03/15/2016  . Breast mass 04/22/2013    Past Surgical History:  History reviewed. No pertinent surgical history.  Gynecologic History:  No LMP recorded. (Menstrual status: IUD). Contraception:03/30/2014 Mirena IUD Last Pap: Results were: 01/04/2016 NIL and HR HPV negative   Obstetric History: [id number]  Family History:  History reviewed. No pertinent family history.  Social History:  Social History   Socioeconomic History  . Marital status: Married    Spouse name:  Not on file  . Number of children: Not on file  . Years of education: Not on file  . Highest education level: Not on file  Occupational History  . Not on file  Tobacco Use  . Smoking status: Never Smoker  . Smokeless tobacco: Never Used  Substance and Sexual Activity  . Alcohol use: No  . Drug use: No  . Sexual activity: Yes    Birth control/protection: I.U.D.  Other Topics Concern  . Not on file  Social History Narrative  . Not on file   Social Determinants of Health   Financial Resource Strain:   . Difficulty of Paying Living Expenses:   Food Insecurity:   . Worried About Programme researcher, broadcasting/film/video in the Last Year:   . Barista in the Last Year:   Transportation Needs:   . Freight forwarder (Medical):   Marland Kitchen Lack of Transportation (Non-Medical):   Physical Activity:   . Days of Exercise per Week:   . Minutes of Exercise per Session:   Stress:   . Feeling of Stress :   Social Connections:   . Frequency of Communication with Friends and Family:   . Frequency of Social Gatherings with Friends and Family:   . Attends Religious Services:   . Active Member of Clubs or Organizations:   . Attends Banker Meetings:   Marland Kitchen Marital Status:   Intimate Partner Violence:   . Fear of Current or Ex-Partner:   . Emotionally Abused:   Marland Kitchen Physically Abused:   . Sexually Abused:     Allergies:  No Known Allergies  Medications: Prior to Admission medications   Medication  Sig Start Date End Date Taking? Authorizing Provider  tretinoin (RETIN-A) 0.025 % gel Apply topically at bedtime. 01/20/19  Yes Malachy Mood, MD    Physical Exam Vitals: Blood pressure 130/80, pulse 81, height 5\' 7"  (1.702 m), weight 163 lb (73.9 kg).  General: NAD HEENT: normocephalic, anicteric Thyroid: no enlargement, no palpable nodules Pulmonary: No increased work of breathing, CTAB Cardiovascular: RRR, distal pulses 2+ Breast: Breast symmetrical, no tenderness, no palpable nodules  or masses, no skin or nipple retraction present, no nipple discharge.  No axillary or supraclavicular lymphadenopathy. Abdomen: NABS, soft, non-tender, non-distended.  Umbilicus without lesions.  No hepatomegaly, splenomegaly or masses palpable. No evidence of hernia  Genitourinary:  External: Normal external female genitalia.  Normal urethral meatus, normal Bartholin's and Skene's glands.    Vagina: Normal vaginal mucosa, no evidence of prolapse.    Cervix: Grossly normal in appearance, no bleeding, IUD strings visualized  Uterus: Non-enlarged, mobile, normal contour.  No CMT  Adnexa: ovaries non-enlarged, no adnexal masses  Rectal: deferred  Lymphatic: no evidence of inguinal lymphadenopathy Extremities: no edema, erythema, or tenderness Neurologic: Grossly intact Psychiatric: mood appropriate, affect full  Female chaperone present for pelvic and breast  portions of the physical exam    Assessment: 44 y.o. G2P2002 routine annual exam  Plan: Problem List Items Addressed This Visit    None    Visit Diagnoses    Encounter for gynecological examination without abnormal finding    -  Primary   Screening for malignant neoplasm of cervix       Relevant Orders   Cytology - PAP   Breast screening       Relevant Orders   MM 3D SCREEN BREAST BILATERAL   Encounter for routine checking of intrauterine contraceptive device (IUD)          1) Mammogram - recommend yearly screening mammogram.  Mammogram Was ordered today   2) STI screening  was notoffered and therefore not obtained  3) ASCCP guidelines and rational discussed.  Patient opts for every 3 years screening interval  4) Contraception - the patient is currently using  IUD.  She is happy with her current form of contraception and plans to continue  5) Colonoscopy -- Screening recommended starting at age 53 for average risk individuals, age 35 for individuals deemed at increased risk (including African Americans) and recommended  to continue until age 107.  For patient age 45-85 individualized approach is recommended.  Gold standard screening is via colonoscopy, Cologuard screening is an acceptable alternative for patient unwilling or unable to undergo colonoscopy.  "Colorectal cancer screening for average?risk adults: 2018 guideline update from the American Cancer Society"[place]: A Cancer Journal for Clinicians: Aug 16, 2016   6) Routine healthcare maintenance including cholesterol, diabetes screening discussed managed by PCP  7) Return in about 1 year (around 08/07/2020) for annual and Mirena IUD insertion.   Malachy Mood, MD, [facility], [facility] 08/08/2019, 10:15 AM

## 2019-08-11 LAB — CYTOLOGY - PAP
Comment: NEGATIVE
Diagnosis: NEGATIVE
High risk HPV: NEGATIVE

## 2019-09-30 DIAGNOSIS — H5203 Hypermetropia, bilateral: Secondary | ICD-10-CM | POA: Diagnosis not present

## 2019-11-28 ENCOUNTER — Ambulatory Visit: Payer: 59 | Admitting: Internal Medicine

## 2019-12-15 ENCOUNTER — Other Ambulatory Visit: Payer: Self-pay

## 2019-12-15 ENCOUNTER — Ambulatory Visit: Payer: 59 | Admitting: Internal Medicine

## 2019-12-15 ENCOUNTER — Encounter: Payer: Self-pay | Admitting: Internal Medicine

## 2019-12-15 VITALS — BP 118/78 | HR 81 | Temp 98.0°F | Resp 14 | Ht 67.0 in | Wt 169.6 lb

## 2019-12-15 DIAGNOSIS — R419 Unspecified symptoms and signs involving cognitive functions and awareness: Secondary | ICD-10-CM | POA: Insufficient documentation

## 2019-12-15 DIAGNOSIS — W57XXXS Bitten or stung by nonvenomous insect and other nonvenomous arthropods, sequela: Secondary | ICD-10-CM

## 2019-12-15 DIAGNOSIS — S30861S Insect bite (nonvenomous) of abdominal wall, sequela: Secondary | ICD-10-CM | POA: Diagnosis not present

## 2019-12-15 DIAGNOSIS — W57XXXA Bitten or stung by nonvenomous insect and other nonvenomous arthropods, initial encounter: Secondary | ICD-10-CM | POA: Insufficient documentation

## 2019-12-15 DIAGNOSIS — R7401 Elevation of levels of liver transaminase levels: Secondary | ICD-10-CM

## 2019-12-15 DIAGNOSIS — R5383 Other fatigue: Secondary | ICD-10-CM | POA: Diagnosis not present

## 2019-12-15 DIAGNOSIS — E785 Hyperlipidemia, unspecified: Secondary | ICD-10-CM

## 2019-12-15 DIAGNOSIS — S30861A Insect bite (nonvenomous) of abdominal wall, initial encounter: Secondary | ICD-10-CM | POA: Insufficient documentation

## 2019-12-15 DIAGNOSIS — N393 Stress incontinence (female) (male): Secondary | ICD-10-CM

## 2019-12-15 LAB — URINALYSIS, ROUTINE W REFLEX MICROSCOPIC
Bilirubin Urine: NEGATIVE
Hgb urine dipstick: NEGATIVE
Ketones, ur: NEGATIVE
Leukocytes,Ua: NEGATIVE
Nitrite: NEGATIVE
RBC / HPF: NONE SEEN (ref 0–?)
Specific Gravity, Urine: 1.01 (ref 1.000–1.030)
Total Protein, Urine: NEGATIVE
Urine Glucose: NEGATIVE
Urobilinogen, UA: 0.2 (ref 0.0–1.0)
WBC, UA: NONE SEEN (ref 0–?)
pH: 7 (ref 5.0–8.0)

## 2019-12-15 LAB — CBC WITH DIFFERENTIAL/PLATELET
Basophils Absolute: 0 10*3/uL (ref 0.0–0.1)
Basophils Relative: 0.3 % (ref 0.0–3.0)
Eosinophils Absolute: 0 10*3/uL (ref 0.0–0.7)
Eosinophils Relative: 0.5 % (ref 0.0–5.0)
HCT: 39.9 % (ref 36.0–46.0)
Hemoglobin: 13.5 g/dL (ref 12.0–15.0)
Lymphocytes Relative: 31.7 % (ref 12.0–46.0)
Lymphs Abs: 2.6 10*3/uL (ref 0.7–4.0)
MCHC: 33.8 g/dL (ref 30.0–36.0)
MCV: 93.3 fl (ref 78.0–100.0)
Monocytes Absolute: 0.5 10*3/uL (ref 0.1–1.0)
Monocytes Relative: 6 % (ref 3.0–12.0)
Neutro Abs: 5.1 10*3/uL (ref 1.4–7.7)
Neutrophils Relative %: 61.5 % (ref 43.0–77.0)
Platelets: 255 10*3/uL (ref 150.0–400.0)
RBC: 4.28 Mil/uL (ref 3.87–5.11)
RDW: 12.8 % (ref 11.5–15.5)
WBC: 8.3 10*3/uL (ref 4.0–10.5)

## 2019-12-15 LAB — COMPREHENSIVE METABOLIC PANEL
ALT: 66 U/L — ABNORMAL HIGH (ref 0–35)
AST: 58 U/L — ABNORMAL HIGH (ref 0–37)
Albumin: 4.6 g/dL (ref 3.5–5.2)
Alkaline Phosphatase: 43 U/L (ref 39–117)
BUN: 11 mg/dL (ref 6–23)
CO2: 24 mEq/L (ref 19–32)
Calcium: 9.6 mg/dL (ref 8.4–10.5)
Chloride: 106 mEq/L (ref 96–112)
Creatinine, Ser: 0.74 mg/dL (ref 0.40–1.20)
GFR: 85.19 mL/min (ref >60.00)
Glucose, Bld: 90 mg/dL (ref 70–99)
Potassium: 4 mEq/L (ref 3.5–5.1)
Sodium: 139 mEq/L (ref 135–145)
Total Bilirubin: 0.7 mg/dL (ref 0.2–1.2)
Total Protein: 7.1 g/dL (ref 6.0–8.3)

## 2019-12-15 LAB — LIPID PANEL
Cholesterol: 236 mg/dL — ABNORMAL HIGH (ref 0–200)
HDL: 75.6 mg/dL (ref >39.00)
LDL Cholesterol: 142 mg/dL — ABNORMAL HIGH (ref 0–99)
NonHDL: 160.45
Total CHOL/HDL Ratio: 3
Triglycerides: 92 mg/dL (ref 0.0–149.0)
VLDL: 18.4 mg/dL (ref 0.0–40.0)

## 2019-12-15 LAB — VITAMIN B12: Vitamin B-12: 427 pg/mL (ref 211–911)

## 2019-12-15 LAB — TSH: TSH: 1.61 u[IU]/mL (ref 0.35–4.50)

## 2019-12-15 MED ORDER — DOXYCYCLINE HYCLATE 100 MG PO TABS: 100.0000 mg | ORAL_TABLET | Freq: Two times a day (BID) | ORAL | 0 refills | Status: DC

## 2019-12-15 NOTE — Assessment & Plan Note (Signed)
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  standing rx for doxycycline given.  Testing for alpha gal in progress

## 2019-12-15 NOTE — Progress Notes (Signed)
Subjective:  Patient ID: Jasmine Freeman, female    DOB: 05-15-1975  Age: 44 y.o. MRN: [id number]  CC: The primary encounter diagnosis was Tick bite of abdomen, sequela. Diagnoses of Cognitive complaints with normal exam, Dyslipidemia, Fatigue, unspecified type, Stress incontinence, and Cognitive complaints with normal neuropsychological exam were also pertinent to this visit.  HPI Jasmine Freeman presents for establishment of care .  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  This visit occurred during the SARS-CoV-2 public health emergency.  Safety protocols were in place, including screening questions prior to the visit, additional usage of staff PPE, and extensive cleaning of exam room while observing appropriate contact time as indicated for disinfecting solutions.   1) Stress incontinence : started after 1 first vaginal delivery ,  Became worse after 2nd pregnancy  5 yrs ago . Cannot cough, sneeze or jump on trampoline without having leakage    Wants pelvic PT   2) chronic throat pain: Right ear, right side of throat chronically irritated,  Hoarse by the end of the day.  Chronic:  Has seen ENT twice . Has tried topical lidocaine which transiently helps the vocal cord soreness.  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  No sneezing,  Eyes watering   3) cognitive complaints: forgetting detail, names of things, appointments.  for the past 1.5 years.   Limits media to 15 minutes daily . forgetting some Some words,  No snoring or sleep issues   No anxiety. Minimal alcohol.  Mother and father both have early dementia and refuse diagnosis and workup.  They are 66 and 72 respectively . Had ADD as college student and was treated through college, but stopped the medication due to weigh loss significant on adderall.   Increased responsibilities have made life more challenging:  Running a farm,  Raising two  children, husband on call q 3 as OB GYN,  Parents live on farm ,  And getting her graduate degree in horticulture    History Blue has no past medical history on file.   She has no past surgical history on file.   Her family history includes COPD in her paternal grandfather; Depression in her father; Hearing loss in her mother; Heart attack in her maternal grandfather and paternal grandfather; Stroke in her paternal grandfather.She reports that she has never smoked. She has never used smokeless tobacco. She reports current alcohol use. She reports that she does not use drugs.  Outpatient Medications Prior to Visit  Medication Sig Dispense Refill  . tretinoin (RETIN-A) 0.025 % gel Apply topically at bedtime. 45 g 3   No facility-administered medications prior to visit.    Review of Systems:  Patient denies headache, fevers, malaise, unintentional weight loss, skin rash, eye pain, sinus congestion and sinus pain, sore throat, dysphagia,  hemoptysis , cough, dyspnea, wheezing, chest pain, palpitations, orthopnea, edema, abdominal pain, nausea, melena, diarrhea, constipation, flank pain, dysuria, hematuria, urinary  Frequency, nocturia, numbness, tingling, seizures,  Focal weakness, Loss of consciousness,  Tremor, insomnia, depression, anxiety, and suicidal ideation.     Objective:  BP 118/78 (BP Location: Left Arm, Patient Position: Sitting, Cuff Size: Normal)   Pulse 81   Temp 98 F (36.7 C) (Oral)   Resp 14   Ht 5\' 7"  (1.702 m)   Wt 169 lb 9.6 oz (76.9 kg)   SpO2 99%  BMI 26.56 kg/m   Physical Exam:   Assessment & Plan:   Problem List Items Addressed This Visit      Unprioritized   Cognitive complaints with normal neuropsychological exam    ADD complicated by increased responsbilities considered the most likely cause.  Need to include early dementia given family history.  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  standing rx for doxycycline given.  Testing for alpha gal in progress       Relevant Orders   Alpha-Gal Panel    Other Visit Diagnoses    Cognitive complaints with normal exam       Relevant Orders   TSH   Vitamin B12   RPR   HIV antibody (with reflex)   Ambulatory referral to Psychology   Dyslipidemia       Relevant Orders   Lipid panel   Fatigue, unspecified type       Relevant Orders   CBC with Differential/Platelet   Comprehensive metabolic panel      I am having Jasmine Freeman start on doxycycline. I am also having her maintain her tretinoin.  Meds ordered this encounter  Medications  . doxycycline (VIBRA-TABS) 100 MG tablet    Sig: Take 1 tablet (100 mg total) by mouth 2 (two) times daily.    Dispense:  20 tablet    Refill:  0    There are no discontinued medications.  Follow-up: Return in about 1 year (around 12/14/2020).   Sherlene Shams, MD

## 2019-12-15 NOTE — Assessment & Plan Note (Signed)
ADD complicated by increased responsbilities considered the most likely cause.  Need to include early dementia given family history.  Screening labs ordered cognitive testing recommended

## 2019-12-15 NOTE — Assessment & Plan Note (Signed)
Referring for pelvic floor muscle  Therapy

## 2019-12-15 NOTE — Patient Instructions (Addendum)
Good to meet you!   Referral for pelvic PT in progress if UTI ruled out    Doxycycline rx sent to [facility]. KEEP ON HAND FOR TICK BITE REACTIONS   Neurocognitive testing to be ordered if all labs are normal

## 2019-12-16 LAB — URINE CULTURE
MICRO NUMBER:: 10999128
Result:: NO GROWTH
SPECIMEN QUALITY:: ADEQUATE

## 2019-12-16 NOTE — Progress Notes (Signed)
Not all labs are resulted yet,  but her urinalysis and thyroid and b12 are normal.  However, her liver enzymes are elevated for unclear reasons.  I would like to repeat them in [redacted] weeks along with some additional labs to rule out hepatitis. Please schedule her a lab appt for these

## 2019-12-16 NOTE — Addendum Note (Signed)
Addended by: Sherlene Shams on: 12/16/2019 08:21 AM   Modules accepted: Orders

## 2019-12-19 LAB — ALPHA-GAL PANEL
Beef IgE: 0.21 kU/L (ref <0.35)
Class: 0
Class: 0
Galactose-alpha-1,3-galactose IgE: 0.52 kU/L — ABNORMAL HIGH (ref <0.10)
LAMB/MUTTON IGE: <0.1 kU/L (ref <0.35)
Pork IgE: <0.1 kU/L (ref <0.35)

## 2019-12-19 LAB — HIV ANTIBODY (ROUTINE TESTING W REFLEX): HIV 1&2 Ab, 4th Generation: NONREACTIVE

## 2019-12-19 LAB — RPR: RPR Ser Ql: NONREACTIVE

## 2019-12-30 ENCOUNTER — Other Ambulatory Visit: Payer: 59

## 2020-02-24 DIAGNOSIS — M722 Plantar fascial fibromatosis: Secondary | ICD-10-CM | POA: Diagnosis not present

## 2020-04-09 ENCOUNTER — Ambulatory Visit: Payer: 59 | Attending: Internal Medicine | Admitting: Physical Therapy

## 2020-04-16 ENCOUNTER — Encounter: Payer: 59 | Admitting: Physical Therapy

## 2020-04-23 ENCOUNTER — Encounter: Payer: 59 | Admitting: Physical Therapy

## 2020-04-30 ENCOUNTER — Encounter: Payer: 59 | Admitting: Physical Therapy

## 2020-05-07 ENCOUNTER — Encounter: Payer: 59 | Admitting: Physical Therapy

## 2020-05-14 ENCOUNTER — Encounter: Payer: 59 | Admitting: Physical Therapy

## 2020-05-21 ENCOUNTER — Encounter: Payer: 59 | Admitting: Physical Therapy

## 2020-05-28 ENCOUNTER — Encounter: Payer: 59 | Admitting: Physical Therapy

## 2020-06-04 ENCOUNTER — Encounter: Payer: 59 | Admitting: Physical Therapy

## 2020-06-21 ENCOUNTER — Telehealth: Payer: Self-pay

## 2020-06-21 NOTE — Telephone Encounter (Signed)
Patient is scheduled for 08/10/20 with AMS at 9:30 for Mirena replacement

## 2020-06-22 NOTE — Telephone Encounter (Signed)
Noted. Will order to arrive by apt date/time. 

## 2020-07-29 ENCOUNTER — Other Ambulatory Visit: Payer: Self-pay

## 2020-07-29 ENCOUNTER — Ambulatory Visit: Payer: 59 | Admitting: Dermatology

## 2020-07-29 DIAGNOSIS — L710 Perioral dermatitis: Secondary | ICD-10-CM

## 2020-07-29 MED ORDER — DOXYCYCLINE HYCLATE 20 MG PO TABS
20.0000 mg | ORAL_TABLET | Freq: Two times a day (BID) | ORAL | 1 refills | Status: AC
  Filled 2020-07-29 (×2): qty 60, 30d supply, fill #0

## 2020-07-29 MED ORDER — DOXYCYCLINE MONOHYDRATE 100 MG PO CAPS
100.0000 mg | ORAL_CAPSULE | Freq: Two times a day (BID) | ORAL | 0 refills | Status: DC
  Filled 2020-07-29 (×2): qty 60, 30d supply, fill #0

## 2020-07-29 NOTE — Progress Notes (Signed)
   New Patient Visit  Subjective  Jasmine Freeman is a 45 y.o. female who presents for the following: New Patient (Initial Visit) (Patient has been dealing with rash around mouth for the past year. Thought it may be contributed by mask but has not been wearing mask as much now and rash is still present.  She has been using hct 2.5 topical but doesn't help. Patient is also using tretinoin 0.025 % gel for years. Patient states in past year she developed alpha gal alergy but other than that she is unsure what could be causing. ).   Objective  Well appearing patient in no apparent distress; mood and affect are within normal limits.  A focused examination was performed including face. Relevant physical exam findings are noted in the Assessment and Plan.  Objective  mid face and perioral: Many inflammatory papules and pustules at the mid face particularly perioral  Assessment & Plan  Perioral dermatitis mid face and perioral  Chronic condition with duration of 1 year. Condition is bothersome to patient. Currently flared.  With possible classic rosacea as well  Start doxycycline 100 mg by mouth twice daily with food for 1 month then continue with lower dose (20 mg) by mouth bid with food.   Will recheck in 2 months   D/c hydrocortisone 2.5 %. Advised topical steroids can trigger or flare this condition.  Recommend Heliocare to help with sun protection  Doxycycline should be taken with food to prevent nausea. Do not lay down for 30 minutes after taking. Be cautious with sun exposure and use good sun protection while on this medication. Pregnant women should not take this medication.   Recommend breaking from tretinoin 0.025% use at this time. Can restart once clear.     doxycycline (MONODOX) 100 MG capsule - mid face and perioral  doxycycline (PERIOSTAT) 20 MG tablet - mid face and perioral  Return in about 2 months (around 09/28/2020) for follow up on perioral dermatitis .  I,  Asher Muir, CMA, am acting as scribe for Darden Dates, MD.   Documentation: I have reviewed the above documentation for accuracy and completeness, and I agree with the above.  Darden Dates, MD

## 2020-07-29 NOTE — Patient Instructions (Addendum)
Rosacea is a chronic progressive skin condition usually affecting the face of adults, causing redness and/or acne bumps. It is treatable but not curable. It sometimes affects the eyes (ocular rosacea) as well. It may respond to topical and/or systemic medication and can flare with stress, sun exposure, alcohol, exercise and some foods.  Daily application of broad spectrum spf 30+ sunscreen to face is recommended to reduce flares.   Doxycycline should be taken with food to prevent nausea. Do not lay down for 30 minutes after taking. Be cautious with sun exposure and use good sun protection while on this medication. Pregnant women should not take this medication.   Recommend taking Heliocare sun protection supplement daily in sunny weather for additional sun protection. For maximum protection on the sunniest days, you can take up to 2 capsules of regular Heliocare OR take 1 capsule of Heliocare Ultra. For prolonged exposure (such as a full day in the sun), you can repeat your dose of the supplement 4 hours after your first dose. Heliocare can be purchased at [facility] or at [email or web address].   Recommend daily broad spectrum sunscreen SPF 30+ to sun-exposed areas, reapply every 2 hours as needed. Call for new or changing lesions.  Staying in the shade or wearing long sleeves, sun glasses (UVA+UVB protection) and wide brim hats (4-inch brim around the entire circumference of the hat) are also recommended for sun protection.    If you have any questions or concerns for your doctor, please call our main line at [phone] and press option 4 to reach your doctor's medical assistant. If no one answers, please leave a voicemail as directed and we will return your call as soon as possible. Messages left after 4 pm will be answered the following business day.   You may also send [place] a message via MyChart. We typically respond to MyChart messages within 1-2 business days.  For prescription refills,  please ask your pharmacy to contact our office. Our fax number is [phone].  If you have an urgent issue when the clinic is closed that cannot wait until the next business day, you can page your doctor at the number below.    Please note that while we do our best to be available for urgent issues outside of office hours, we are not available 24/7.   If you have an urgent issue and are unable to reach [place], you may choose to seek medical care at your doctor's office, retail clinic, urgent care center, or emergency room.  If you have a medical emergency, please immediately call 911 or go to the emergency department.  Pager Numbers  - Dr. Gwen Pounds: [phone]  - Dr. Neale Burly: [phone]  - Dr. Roseanne Reno: [phone]  In the event of inclement weather, please call our main line at [phone] for an update on the status of any delays or closures.  Dermatology Medication Tips: Please keep the boxes that topical medications come in in order to help keep track of the instructions about where and how to use these. Pharmacies typically print the medication instructions only on the boxes and not directly on the medication tubes.   If your medication is too expensive, please contact our office at [phone] option 4 or send [place] a message through MyChart.   We are unable to tell what your co-pay for medications will be in advance as this is different depending on your insurance coverage. However, we may be able to find a substitute medication at lower cost or fill  out paperwork to get insurance to cover a needed medication.   If a prior authorization is required to get your medication covered by your insurance company, please allow [place] 1-2 business days to complete this process.  Drug prices often vary depending on where the prescription is filled and some pharmacies may offer cheaper prices.  The website www.goodrx.com contains coupons for medications through different pharmacies. The prices  here do not account for what the cost may be with help from insurance (it may be cheaper with your insurance), but the website can give you the price if you did not use any insurance.  - You can print the associated coupon and take it with your prescription to the pharmacy.  - You may also stop by our office during regular business hours and pick up a GoodRx coupon card.  - If you need your prescription sent electronically to a different pharmacy, notify our office through [facility] or by phone at [phone] option 4.

## 2020-08-02 ENCOUNTER — Other Ambulatory Visit: Payer: Self-pay

## 2020-08-02 ENCOUNTER — Encounter: Payer: Self-pay | Admitting: Dermatology

## 2020-08-05 NOTE — Addendum Note (Signed)
Addended by: Sandi Mealy on: 08/05/2020 10:38 AM   Modules accepted: Level of Service

## 2020-08-09 ENCOUNTER — Telehealth: Payer: Self-pay

## 2020-08-09 NOTE — Telephone Encounter (Signed)
Pt called to let Dr Neale Burly know since starting Doxycyline, her joints have started hurting and she would like to know if Doxycyline could be causing the joint pain

## 2020-08-09 NOTE — Telephone Encounter (Signed)
Joint pain is not a common side effect of doxycycline but it's not impossible. I would recommend stopping the doxycycline and see how her joints do. If they don't get better, I would recommend follow-up with primary care.   I believe we have some samples left of Zilxi we could give her a sample to use (thin layer to affected areas at night) and see how she does with that. If it seems to be working, she can call us and we can send in the prescription to [facility].  Thank you

## 2020-08-10 ENCOUNTER — Encounter: Payer: Self-pay | Admitting: Obstetrics and Gynecology

## 2020-08-10 ENCOUNTER — Ambulatory Visit (INDEPENDENT_AMBULATORY_CARE_PROVIDER_SITE_OTHER): Payer: 59 | Admitting: Obstetrics and Gynecology

## 2020-08-10 ENCOUNTER — Telehealth: Payer: Self-pay

## 2020-08-10 ENCOUNTER — Other Ambulatory Visit: Payer: Self-pay

## 2020-08-10 VITALS — BP 112/68 | Ht 67.0 in | Wt 164.0 lb

## 2020-08-10 DIAGNOSIS — Z1211 Encounter for screening for malignant neoplasm of colon: Secondary | ICD-10-CM

## 2020-08-10 DIAGNOSIS — R5383 Other fatigue: Secondary | ICD-10-CM | POA: Diagnosis not present

## 2020-08-10 DIAGNOSIS — M2550 Pain in unspecified joint: Secondary | ICD-10-CM

## 2020-08-10 DIAGNOSIS — Z1239 Encounter for other screening for malignant neoplasm of breast: Secondary | ICD-10-CM

## 2020-08-10 DIAGNOSIS — R52 Pain, unspecified: Secondary | ICD-10-CM | POA: Diagnosis not present

## 2020-08-10 DIAGNOSIS — Z30432 Encounter for removal of intrauterine contraceptive device: Secondary | ICD-10-CM

## 2020-08-10 DIAGNOSIS — Z30430 Encounter for insertion of intrauterine contraceptive device: Secondary | ICD-10-CM

## 2020-08-10 DIAGNOSIS — Z01419 Encounter for gynecological examination (general) (routine) without abnormal findings: Secondary | ICD-10-CM | POA: Diagnosis not present

## 2020-08-10 NOTE — Telephone Encounter (Signed)
Returned pts, call to discuss Dr Onnie Boer answer on Doxycycline and joint pain, pt driving she will call back when she get a chance

## 2020-08-10 NOTE — Telephone Encounter (Signed)
Called pt discussed   Joint pain is not a common side effect of doxycycline but it's not impossible. I would recommend stopping the doxycycline and see how her joints do. If they don't get better, I would recommend follow-up with primary care.   Samples of Zilxi we could give her a sample to use (thin layer to affected areas at night) and see how she does with that. If it seems to be working, she can call us and we can send in the prescription to [facility]. Pt will come in the office to pick up samples.

## 2020-08-10 NOTE — Progress Notes (Signed)
Gynecology Annual Exam  PCP: Crecencio Mc, MD  Chief Complaint:  Chief Complaint  Patient presents with  . Gynecologic Exam    Annual - Remove/Replace IUD (Mirena), joint pain. RM 5    History of Present Illness: Patient is a 45 y.o. [id number] presents for annual exam. The patient has no complaints today.   LMP: No LMP recorded. (Menstrual status: IUD).  The patient is sexually active. She currently uses IUD for contraception. She denies dyspareunia.  The patient does perform self breast exams.  There is no notable family history of breast or ovarian cancer in her family.  The patient wears seatbelts: yes.   The patient has regular exercise: not asked.    The patient denies current symptoms of depression.    Patient with new onset fatigue and and joint pains.  She is having an evaluation for alpha gau by a specialist in [place] later in August  Review of Systems: ROS  Past Medical History:  Patient Active Problem List   Diagnosis Date Noted  . Cognitive complaints with normal neuropsychological exam 12/15/2019  . Tick bite of abdomen 12/15/2019  . Stress incontinence 12/15/2019  . Anxiety with depression 03/15/2016  . Vaccine counseling 03/15/2016  . Breast mass 04/22/2013    Past Surgical History:  History reviewed. No pertinent surgical history.  Gynecologic History:  No LMP recorded. (Menstrual status: IUD). Contraception:03/30/2014 Mirena  IUD Last Pap: Results were: 08/08/2019 NIL and HR HPV negative   Obstetric History: [id number]  Family History:  Family History  Problem Relation Age of Onset  . Hearing loss Mother   . Depression Father   . Heart attack Maternal Grandfather   . COPD Paternal Grandfather   . Stroke Paternal Grandfather   . Heart attack Paternal Grandfather     Social History:  Social History   Socioeconomic History  . Marital status: Married    Spouse name: Not on file  . Number of children: 2  . Years of education: Not on file   . Highest education level: Not on file  Occupational History  . Not on file  Tobacco Use  . Smoking status: Never Smoker  . Smokeless tobacco: Never Used  Vaping Use  . Vaping Use: Never used  Substance and Sexual Activity  . Alcohol use: Yes  . Drug use: No  . Sexual activity: Yes    Birth control/protection: I.U.D.  Other Topics Concern  . Not on file  Social History Narrative  . Not on file   Social Determinants of Health   Financial Resource Strain: Not on file  Food Insecurity: Not on file  Transportation Needs: Not on file  Physical Activity: Not on file  Stress: Not on file  Social Connections: Not on file  Intimate Partner Violence: Not on file    Allergies:  Allergies  Allergen Reactions  . Alpha-Gal     Medications: Prior to Admission medications   Medication Sig Start Date End Date Taking? Authorizing Provider  doxycycline (MONODOX) 100 MG capsule Take 1 capsule (100 mg total) by mouth 2 (two) times daily. With food Patient not taking: Reported on 08/10/2020 07/29/20   Laurence Ferrari, [place], MD  doxycycline (PERIOSTAT) 20 MG tablet Take 1 tablet (20 mg total) by mouth 2 (two) times daily. Start after completing 100 mg bottle. Take with food Patient not taking: Reported on 08/10/2020 07/29/20 08/28/20  Laurence Ferrari, [place], MD  tretinoin (RETIN-A) 0.025 % gel Apply topically at bedtime. Patient not taking: Reported on  08/10/2020 01/20/19   Malachy Mood, MD    Physical Exam Vitals: Blood pressure 112/68, height _0  (1.702 m), weight 164 lb (74.4 kg).  General: NAD HEENT: normocephalic, anicteric Thyroid: no enlargement, no palpable nodules Pulmonary: No increased work of breathing, CTAB Cardiovascular: RRR, distal pulses 2+ Breast: Breast symmetrical, no tenderness, no palpable nodules or masses, no skin or nipple retraction present, no nipple discharge.  No axillary or supraclavicular lymphadenopathy. Abdomen: NABS, soft, non-tender, non-distended.  Umbilicus  without lesions.  No hepatomegaly, splenomegaly or masses palpable. No evidence of hernia  Genitourinary:  External: Normal external female genitalia.  Normal urethral meatus, normal Bartholin's and Skene's glands.    Vagina: Normal vaginal mucosa, no evidence of prolapse.    Cervix: Grossly normal in appearance, no bleeding  Uterus: Non-enlarged, mobile, normal contour.  No CMT  Adnexa: ovaries non-enlarged, no adnexal masses  Rectal: deferred  Lymphatic: no evidence of inguinal lymphadenopathy Extremities: no edema, erythema, or tenderness Neurologic: Grossly intact Psychiatric: mood appropriate, affect full  Female chaperone present for pelvic and breast  portions of the physical exam  GYNECOLOGY OFFICE PROCEDURE NOTE  Jasmine Freeman is a 45 y.o. [id number] here for IUD removal and reinsertion. The patient currently has a Mirena IUD placed on 2016, which will be replaced with a Mirena IUD today.  No GYN concerns.  Last pap smear was on 2021 and was normal.  IUD Removal and Reinsertion  Patient identified, informed consent performed, consent signed.   Discussed risks of irregular bleeding, cramping, infection, malpositioning or uterine perforation of the IUD which may require further procedures. Time out was performed. Speculum placed in the vagina. The strings of the IUD were grasped and pulled using ring forceps. The IUD was successfully removed in its entirety. The cervix was cleaned with Betadine x 2 and grasped anteriorly with a single tooth tenaculum.  The uterus was sounded to IUD insertion apparatus was used to sound the uterus to 8.5 cm using a uterine sound.  The IUD was then placed per manufacturer's recommendations. Strings trimmed to 3 cm. Tenaculum was removed, good hemostasis noted. Patient tolerated procedure well.   Patient was given post-procedure instructions.  Patient was also asked to check IUD strings periodically and follow up in 6 weeks for IUD check.  Assessment:  46 y.o. G2P2002 routine annual exam  Plan: Problem List Items Addressed This Visit   None   Visit Diagnoses    Breast screening    -  Primary   Relevant Orders   MM 3D SCREEN BREAST BILATERAL   Encounter for gynecological examination without abnormal finding       Encounter for IUD removal       Encounter for IUD insertion       Colon cancer screening       Relevant Orders   Ambulatory referral to Gastroenterology   Fatigue, unspecified type       Relevant Orders   Sed Rate (ESR)   Rheumatoid Factor   Antinuclear Antib (ANA)   Antiphospholipid syndrome eval, bld   Mitochondrial Antibodies   Arthralgia, unspecified joint       Relevant Orders   Sed Rate (ESR)   Rheumatoid Factor   Antinuclear Antib (ANA)   Antiphospholipid syndrome eval, bld   Mitochondrial Antibodies   Body aches       Relevant Orders   Sed Rate (ESR)   Rheumatoid Factor   Antinuclear Antib (ANA)   Antiphospholipid syndrome eval, bld   Mitochondrial Antibodies  1) Mammogram - recommend yearly screening mammogram.  Mammogram Was ordered today   2) STI screening  was notoffered and therefore not obtained  3) ASCCP guidelines and rational discussed.  Patient opts for every 3 years screening interval  4) Contraception - the patient is currently using  IUD.  She is happy with her current form of contraception and plans to continue  5) Colonoscopy -- Screening recommended starting at age 107 for average risk individuals, age 78 for individuals deemed at increased risk (including African Americans) and recommended to continue until age 41.  For patient age 68-85 individualized approach is recommended.  Gold standard screening is via colonoscopy, Cologuard screening is an acceptable alternative for patient unwilling or unable to undergo colonoscopy.  "Colorectal cancer screening for average?risk adults: 2018 guideline update from the American Cancer Society"CA: A Cancer Journal for Clinicians: Aug 16, 2016    6) Routine healthcare maintenance including cholesterol, diabetes screening discussed managed by PCP  7) Fatigue, joint aches - will obtain lab work up today rule out lupus or RA  8) Return in about 1 year (around 08/10/2021) for annual.   Malachy Mood, MD, [place], [place] 08/10/2020, 9:55 AM

## 2020-08-10 NOTE — Patient Instructions (Signed)
[facility] [address] [place] [address]  [facility] [place]  [address] [place] [address]  Phone: [phone], 

## 2020-08-13 LAB — ANTIPHOSPHOLIPID SYNDROME EVAL, BLD
APTT PPP: 24.5 s (ref 22.9–30.2)
Anticardiolipin IgG: <9 GPL U/mL (ref 0–14)
Anticardiolipin IgM: <9 MPL U/mL (ref 0–12)
Beta-2 Glyco 1 IgM: 10 GPI IgM units (ref 0–32)
Beta-2 Glyco I IgG: <9 GPI IgG units (ref 0–20)
Dilute Viper Venom Time: 30 s (ref 0.0–47.0)
Hexagonal Phase Phospholipid: 4 s (ref 0–11)
INR: 1 (ref 0.9–1.2)
PT: 10.6 s (ref 9.1–12.0)
Thrombin Time: 18.2 s (ref 0.0–23.0)

## 2020-08-13 LAB — MITOCHONDRIAL ANTIBODIES: Mitochondrial Ab: <20 Units (ref 0.0–20.0)

## 2020-08-13 LAB — COAG STUDIES INTERP REPORT

## 2020-08-13 LAB — ANA: Anti Nuclear Antibody (ANA): NEGATIVE

## 2020-08-13 LAB — RHEUMATOID FACTOR: Rhuematoid fact SerPl-aCnc: <10 IU/mL (ref <14.0)

## 2020-08-13 LAB — SEDIMENTATION RATE: Sed Rate: 2 mm/hr (ref 0–32)

## 2020-08-31 ENCOUNTER — Other Ambulatory Visit: Payer: Self-pay

## 2020-08-31 DIAGNOSIS — L710 Perioral dermatitis: Secondary | ICD-10-CM

## 2020-08-31 MED ORDER — ZILXI 1.5 % EX FOAM
CUTANEOUS | 1 refills | Status: AC
Start: 2020-08-31 — End: ?

## 2020-08-31 NOTE — Progress Notes (Signed)
Pt called to have Zilixi sent in to [facility]. Rx sent.

## 2020-09-02 ENCOUNTER — Other Ambulatory Visit
Admission: RE | Admit: 2020-09-02 | Discharge: 2020-09-02 | Disposition: A | Discharge status: Home / Self Care | Payer: 59 | Source: Ambulatory Visit | Attending: Rheumatology | Admitting: Rheumatology

## 2020-09-02 DIAGNOSIS — M25552 Pain in left hip: Secondary | ICD-10-CM | POA: Diagnosis not present

## 2020-09-02 DIAGNOSIS — M25562 Pain in left knee: Secondary | ICD-10-CM | POA: Diagnosis not present

## 2020-09-02 DIAGNOSIS — M4316 Spondylolisthesis, lumbar region: Secondary | ICD-10-CM | POA: Diagnosis not present

## 2020-09-02 DIAGNOSIS — M47816 Spondylosis without myelopathy or radiculopathy, lumbar region: Secondary | ICD-10-CM | POA: Diagnosis not present

## 2020-09-02 DIAGNOSIS — M25551 Pain in right hip: Secondary | ICD-10-CM | POA: Diagnosis not present

## 2020-09-02 DIAGNOSIS — M25561 Pain in right knee: Secondary | ICD-10-CM | POA: Diagnosis not present

## 2020-09-02 DIAGNOSIS — G8929 Other chronic pain: Secondary | ICD-10-CM | POA: Insufficient documentation

## 2020-09-02 DIAGNOSIS — M545 Low back pain, unspecified: Secondary | ICD-10-CM | POA: Diagnosis not present

## 2020-09-02 DIAGNOSIS — M1990 Unspecified osteoarthritis, unspecified site: Secondary | ICD-10-CM | POA: Insufficient documentation

## 2020-09-02 DIAGNOSIS — R7989 Other specified abnormal findings of blood chemistry: Secondary | ICD-10-CM | POA: Insufficient documentation

## 2020-09-02 DIAGNOSIS — M1711 Unilateral primary osteoarthritis, right knee: Secondary | ICD-10-CM | POA: Diagnosis not present

## 2020-09-02 DIAGNOSIS — M1712 Unilateral primary osteoarthritis, left knee: Secondary | ICD-10-CM | POA: Diagnosis not present

## 2020-09-02 DIAGNOSIS — M25661 Stiffness of right knee, not elsewhere classified: Secondary | ICD-10-CM | POA: Diagnosis not present

## 2020-09-02 DIAGNOSIS — M160 Bilateral primary osteoarthritis of hip: Secondary | ICD-10-CM | POA: Diagnosis not present

## 2020-09-02 LAB — SYNOVIAL CELL COUNT + DIFF, W/ CRYSTALS
Crystals, Fluid: NONE SEEN
Eosinophils-Synovial: 0 %
Lymphocytes-Synovial Fld: 33 %
Monocyte-Macrophage-Synovial Fluid: 61 %
Neutrophil, Synovial: 9 %
WBC, Synovial: 211 /mm3 — ABNORMAL HIGH (ref 0–200)

## 2020-09-27 ENCOUNTER — Other Ambulatory Visit: Payer: Self-pay

## 2020-09-27 ENCOUNTER — Telehealth (INDEPENDENT_AMBULATORY_CARE_PROVIDER_SITE_OTHER): Payer: 59 | Admitting: Gastroenterology

## 2020-09-27 DIAGNOSIS — Z1211 Encounter for screening for malignant neoplasm of colon: Secondary | ICD-10-CM

## 2020-09-27 MED ORDER — NA SULFATE-K SULFATE-MG SULF 17.5-3.13-1.6 GM/177ML PO SOLN
1.0000 | Freq: Once | ORAL | 0 refills | Status: AC
  Filled 2020-09-27: qty 354, 1d supply, fill #0

## 2020-09-27 NOTE — Progress Notes (Signed)
Gastroenterology Pre-Procedure Review  Request Date: 11/09/20 Requesting Physician: Dr. Maximino Greenland  PATIENT REVIEW QUESTIONS: The patient responded to the following health history questions as indicated:    1. Are you having any GI issues? no 2. Do you have a personal history of Polyps? no 3. Do you have a family history of Colon Cancer or Polyps? no 4. Diabetes Mellitus? no 5. Joint replacements in the past 12 months?no 6. Major health problems in the past 3 months?no 7. Any artificial heart valves, MVP, or defibrillator?no    MEDICATIONS & ALLERGIES:    Patient reports the following regarding taking any anticoagulation/antiplatelet therapy:   Plavix, Coumadin, Eliquis, Xarelto, Lovenox, Pradaxa, Brilinta, or Effient? no Aspirin? no  Patient confirms/reports the following medications:  Current Outpatient Medications  Medication Sig Dispense Refill   doxycycline (MONODOX) 100 MG capsule Take 1 capsule (100 mg total) by mouth 2 (two) times daily. With food (Patient not taking: Reported on 08/10/2020) 60 capsule 0   Minocycline HCl Micronized (ZILXI) 1.5 % FOAM Apply a thin layer to affected areas at night 30 g 1   tretinoin (RETIN-A) 0.025 % gel Apply topically at bedtime. (Patient not taking: Reported on 08/10/2020) 45 g 3   No current facility-administered medications for this visit.    Patient confirms/reports the following allergies:  Allergies  Allergen Reactions   Alpha-Gal     No orders of the defined types were placed in this encounter.   AUTHORIZATION INFORMATION Primary Insurance: 1D#: Group #:  Secondary Insurance: 1D#: Group #:  SCHEDULE INFORMATION: Date: 11/09/20 Time: Location: [facility]

## 2020-10-01 DIAGNOSIS — H5203 Hypermetropia, bilateral: Secondary | ICD-10-CM | POA: Diagnosis not present

## 2020-10-11 ENCOUNTER — Other Ambulatory Visit: Payer: Self-pay

## 2020-10-26 ENCOUNTER — Other Ambulatory Visit: Payer: Self-pay

## 2020-10-26 MED ORDER — CARESTART COVID-19 HOME TEST VI KIT
PACK | 0 refills | Status: AC
  Filled 2020-10-26: qty 2, 4d supply, fill #0

## 2020-11-03 ENCOUNTER — Encounter: Payer: Self-pay | Admitting: Gastroenterology

## 2020-11-03 ENCOUNTER — Encounter: Payer: Self-pay | Admitting: Anesthesiology

## 2020-11-03 ENCOUNTER — Ambulatory Visit: Payer: 59 | Admitting: Dermatology

## 2020-11-04 ENCOUNTER — Other Ambulatory Visit: Payer: Self-pay

## 2020-11-04 ENCOUNTER — Ambulatory Visit: Payer: 59 | Admitting: Dermatology

## 2020-11-04 DIAGNOSIS — L719 Rosacea, unspecified: Secondary | ICD-10-CM

## 2020-11-04 DIAGNOSIS — L988 Other specified disorders of the skin and subcutaneous tissue: Secondary | ICD-10-CM | POA: Diagnosis not present

## 2020-11-04 NOTE — Progress Notes (Signed)
   Follow-Up Visit   Subjective  Jasmine Freeman is a 45 y.o. female who presents for the following: Follow-up (Patient here today for perioral dermatitis follow up. Patient was started on doxycycline but developed joint pain so discontinued and started Zilixi. Patient advises she has had improvement. ) and Facial Elastosis (Patient would like to discuss treatments for aging. ).   The following portions of the chart were reviewed this encounter and updated as appropriate:   Tobacco  Allergies  Meds  Problems  Med Hx  Surg Hx  Fam Hx      Review of Systems:  No other skin or systemic complaints except as noted in HPI or Assessment and Plan.  Objective  Well appearing patient in no apparent distress; mood and affect are within normal limits.  A focused examination was performed including face. Relevant physical exam findings are noted in the Assessment and Plan.  Head - Anterior (Face) Mid face erythema with telangiectasias +/- scattered inflammatory papules.   Head - Anterior (Face) Rhytides and volume loss.    Assessment & Plan  Rosacea Head - Anterior (Face)  Chronic condition with duration or expected duration over one year. Condition is bothersome to patient. Currently flared.  Rosacea is a chronic progressive skin condition usually affecting the face of adults, causing redness and/or acne bumps. It is treatable but not curable. It sometimes affects the eyes (ocular rosacea) as well. It may respond to topical and/or systemic medication and can flare with stress, sun exposure, alcohol, exercise and some foods.  Daily application of broad spectrum spf 30+ sunscreen to face is recommended to reduce flares.  Could consider minocycline if needed. She previously developed joint pain while on doxycycline.  Will prescribe [facility] metronidazole/ivermectin/azelaic acid twice daily as needed to affected areas on the face. The patient was advised this is not covered by  insurance since it is made by a compounding pharmacy. They will receive an email to check out and the medication will be mailed to their home.    Elastosis of skin Head - Anterior (Face)  Recommend 1 syringe of Restylane Defyne at nasolabial folds and possibly crease at chin Recommend 1 syringe of Voluma to mid face using cannula   Return in about 3 months (around 02/04/2021) for Rosacea, filler.  Anise Salvo, RMA, am acting as scribe for Darden Dates, MD .  Documentation: I have reviewed the above documentation for accuracy and completeness, and I agree with the above.  Darden Dates, MD

## 2020-11-04 NOTE — Patient Instructions (Addendum)
Instructions for [facility] Medications  One or more of your medications was sent to the [facility] mail order compounding pharmacy. You will receive an email from them and can purchase the medicine through that link. It will then be mailed to your home at the address you confirmed. If for any reason you do not receive an email from them, please check your spam folder. If you still do not find the email, please let us know. [facility] phone number is [phone].   Will prescribe [facility] metronidazole/ivermectin/azelaic acid twice daily as needed to affected areas on the face. The patient was advised this is not covered by insurance since it is made by a compounding pharmacy. They will receive an email to check out and the medication will be mailed to their home.   Dr. Geraldine Solar Cox at [facility] in [facility]   If you have any questions or concerns for your doctor, please call our main line at [phone] and press option 4 to reach your doctor's medical assistant. If no one answers, please leave a voicemail as directed and we will return your call as soon as possible. Messages left after 4 pm will be answered the following business day.   You may also send [place] a message via MyChart. We typically respond to MyChart messages within 1-2 business days.  For prescription refills, please ask your pharmacy to contact our office. Our fax number is [phone].  If you have an urgent issue when the clinic is closed that cannot wait until the next business day, you can page your doctor at the number below.    Please note that while we do our best to be available for urgent issues outside of office hours, we are not available 24/7.   If you have an urgent issue and are unable to reach [place], you may choose to seek medical care at your doctor's office, retail clinic, urgent care center, or emergency room.  If you have a medical emergency, please immediately call  911 or go to the emergency department.  Pager Numbers  - Dr. Gwen Pounds: [phone]  - Dr. Neale Burly: [phone]  - Dr. Roseanne Reno: [phone]  In the event of inclement weather, please call our main line at [phone] for an update on the status of any delays or closures.  Dermatology Medication Tips: Please keep the boxes that topical medications come in in order to help keep track of the instructions about where and how to use these. Pharmacies typically print the medication instructions only on the boxes and not directly on the medication tubes.   If your medication is too expensive, please contact our office at [phone] option 4 or send [place] a message through MyChart.   We are unable to tell what your co-pay for medications will be in advance as this is different depending on your insurance coverage. However, we may be able to find a substitute medication at lower cost or fill out paperwork to get insurance to cover a needed medication.   If a prior authorization is required to get your medication covered by your insurance company, please allow [place] 1-2 business days to complete this process.  Drug prices often vary depending on where the prescription is filled and some pharmacies may offer cheaper prices.  The website www.goodrx.com contains coupons for medications through different pharmacies. The prices here do not account for what the cost may be with help from insurance (it may be cheaper with your insurance), but  the website can give you the price if you did not use any insurance.  - You can print the associated coupon and take it with your prescription to the pharmacy.  - You may also stop by our office during regular business hours and pick up a GoodRx coupon card.  - If you need your prescription sent electronically to a different pharmacy, notify our office through [facility] or by phone at [phone] option 4.

## 2020-11-09 ENCOUNTER — Encounter: Payer: Self-pay | Admitting: Dermatology

## 2020-12-22 ENCOUNTER — Ambulatory Visit
Admission: RE | Admit: 2020-12-22 | Discharge: 2020-12-22 | Disposition: A | Discharge status: Home / Self Care | Payer: 59 | Source: Ambulatory Visit | Attending: Obstetrics and Gynecology | Admitting: Obstetrics and Gynecology

## 2020-12-22 ENCOUNTER — Other Ambulatory Visit: Payer: Self-pay

## 2020-12-22 DIAGNOSIS — Z1231 Encounter for screening mammogram for malignant neoplasm of breast: Secondary | ICD-10-CM | POA: Diagnosis not present

## 2020-12-22 DIAGNOSIS — Z1239 Encounter for other screening for malignant neoplasm of breast: Secondary | ICD-10-CM

## 2021-01-12 NOTE — Telephone Encounter (Signed)
Mirena rcvd/charged 08/10/2020

## 2021-02-08 ENCOUNTER — Telehealth: Payer: Self-pay

## 2021-02-08 ENCOUNTER — Other Ambulatory Visit: Payer: Self-pay

## 2021-02-08 MED ORDER — NA SULFATE-K SULFATE-MG SULF 17.5-3.13-1.6 GM/177ML PO SOLN
1.0000 | Freq: Once | ORAL | 0 refills | Status: AC
  Filled 2021-02-08: qty 354, fill #0

## 2021-02-08 NOTE — Telephone Encounter (Signed)
Gastroenterology Pre-Procedure Review  Request Date: 02/24/21 Requesting Physician: Dr. Bonna Gains  PATIENT REVIEW QUESTIONS: The patient responded to the following health history questions as indicated:    1. Are you having any GI issues? no 2. Do you have a personal history of Polyps? no 3. Do you have a family history of Colon Cancer or Polyps? no 4. Diabetes Mellitus? no 5. Joint replacements in the past 12 months?no 6. Major health problems in the past 3 months?no 7. Any artificial heart valves, MVP, or defibrillator?no    MEDICATIONS & ALLERGIES:    Patient reports the following regarding taking any anticoagulation/antiplatelet therapy:   Plavix, Coumadin, Eliquis, Xarelto, Lovenox, Pradaxa, Brilinta, or Effient? no Aspirin? no  Patient confirms/reports the following medications:  Current Outpatient Medications  Medication Sig Dispense Refill   COVID-19 At Home Antigen Test (CARESTART COVID-19 HOME TEST) KIT use as directed 2 kit 0   Minocycline HCl Micronized (ZILXI) 1.5 % FOAM Apply a thin layer to affected areas at night 30 g 1   tretinoin (RETIN-A) 0.025 % gel Apply topically at bedtime. 45 g 3   TURMERIC CURCUMIN PO Take by mouth.     No current facility-administered medications for this visit.    Patient confirms/reports the following allergies:  Allergies  Allergen Reactions   Alpha-Gal     No orders of the defined types were placed in this encounter.   AUTHORIZATION INFORMATION Primary Insurance: 1D#: Group #:  Secondary Insurance: 1D#: Group #:  SCHEDULE INFORMATION: Date:  Time: Location:

## 2021-02-09 ENCOUNTER — Telehealth: Payer: Self-pay

## 2021-02-09 ENCOUNTER — Other Ambulatory Visit: Payer: Self-pay

## 2021-02-09 MED ORDER — NA SULFATE-K SULFATE-MG SULF 17.5-3.13-1.6 GM/177ML PO SOLN
1.0000 | Freq: Once | ORAL | 0 refills | Status: AC
  Filled 2021-02-09: qty 354, 1d supply, fill #0

## 2021-02-09 NOTE — Telephone Encounter (Signed)
Gastroenterology Pre-Procedure Review  Request Date: 02/24/21 Requesting Physician: Dr. Bonna Gains  PATIENT REVIEW QUESTIONS: The patient responded to the following health history questions as indicated:    1. Are you having any GI issues? no 2. Do you have a personal history of Polyps? no 3. Do you have a family history of Colon Cancer or Polyps? no 4. Diabetes Mellitus? no 5. Joint replacements in the past 12 months?no 6. Major health problems in the past 3 months?no 7. Any artificial heart valves, MVP, or defibrillator?no    MEDICATIONS & ALLERGIES:    Patient reports the following regarding taking any anticoagulation/antiplatelet therapy:   Plavix, Coumadin, Eliquis, Xarelto, Lovenox, Pradaxa, Brilinta, or Effient? no Aspirin? no  Patient confirms/reports the following medications:  Current Outpatient Medications  Medication Sig Dispense Refill   COVID-19 At Home Antigen Test (CARESTART COVID-19 HOME TEST) KIT use as directed 2 kit 0   Minocycline HCl Micronized (ZILXI) 1.5 % FOAM Apply a thin layer to affected areas at night 30 g 1   Na Sulfate-K Sulfate-Mg Sulf 17.5-3.13-1.6 GM/177ML SOLN Take 1 kit by mouth once for 1 dose. 354 mL 0   tretinoin (RETIN-A) 0.025 % gel Apply topically at bedtime. 45 g 3   TURMERIC CURCUMIN PO Take by mouth.     No current facility-administered medications for this visit.    Patient confirms/reports the following allergies:  Allergies  Allergen Reactions   Alpha-Gal     No orders of the defined types were placed in this encounter.   AUTHORIZATION INFORMATION Primary Insurance: 1D#: Group #:  Secondary Insurance: 1D#: Group #:  SCHEDULE INFORMATION: Date:  Time: Location:

## 2021-02-16 ENCOUNTER — Ambulatory Visit: Payer: 59 | Admitting: Dermatology

## 2021-02-22 ENCOUNTER — Telehealth: Payer: Self-pay

## 2021-02-22 ENCOUNTER — Other Ambulatory Visit: Payer: Self-pay

## 2021-02-22 NOTE — Telephone Encounter (Signed)
I spoke to pt after she called the endoscopy stating that she never received her prep and did not know what she was supposed to do  I called [facility] pharmacy and they confirmed that they receive Rx on 02/09/21 and it is ready.  I called pt back and let her know that the pharmacy has the prep ready for her to pick up. She proceeded to ask about the instructions and saying that she never received them in the mail. I apologized, as the recent holiday may have made it more of a delay. I asked if she was able to view the instructions via mychart, pt proceeded to say that our communication sucks and she was expecting someone to call her again. Pt also stated that she already is being billed for the procedure and would like to just cancel the procedure at this time  I told the pt that I will cancel the procedure, called Endo to cancel

## 2021-02-24 ENCOUNTER — Other Ambulatory Visit: Payer: Self-pay

## 2021-02-24 ENCOUNTER — Ambulatory Visit: Admission: RE | Admit: 2021-02-24 | Payer: 59 | Source: Home / Self Care | Admitting: Gastroenterology

## 2021-02-24 HISTORY — DX: Presence of spectacles and contact lenses: Z97.3

## 2021-02-24 HISTORY — DX: Unspecified osteoarthritis, unspecified site: M19.90

## 2021-02-24 HISTORY — DX: Motion sickness, initial encounter: T75.3XXA

## 2021-02-24 SURGERY — COLONOSCOPY WITH PROPOFOL
Anesthesia: Choice

## 2021-03-08 ENCOUNTER — Other Ambulatory Visit: Payer: Self-pay | Admitting: Obstetrics and Gynecology

## 2021-03-08 MED ORDER — ONDANSETRON 4 MG PO TBDP: 4.0000 mg | ORAL_TABLET | Freq: Four times a day (QID) | ORAL | 0 refills | Status: AC | PRN

## 2021-03-08 NOTE — Progress Notes (Signed)
Nausea emesis while traveling Rx zofran

## 2021-07-06 DIAGNOSIS — Z1239 Encounter for other screening for malignant neoplasm of breast: Secondary | ICD-10-CM | POA: Diagnosis not present

## 2021-07-06 DIAGNOSIS — Z01419 Encounter for gynecological examination (general) (routine) without abnormal findings: Secondary | ICD-10-CM | POA: Diagnosis not present

## 2021-07-06 DIAGNOSIS — Z1211 Encounter for screening for malignant neoplasm of colon: Secondary | ICD-10-CM | POA: Diagnosis not present

## 2021-07-06 DIAGNOSIS — Z1331 Encounter for screening for depression: Secondary | ICD-10-CM | POA: Diagnosis not present

## 2021-08-23 DIAGNOSIS — M26621 Arthralgia of right temporomandibular joint: Secondary | ICD-10-CM | POA: Diagnosis not present

## 2021-08-23 DIAGNOSIS — H9201 Otalgia, right ear: Secondary | ICD-10-CM | POA: Diagnosis not present

## 2021-08-23 DIAGNOSIS — H93293 Other abnormal auditory perceptions, bilateral: Secondary | ICD-10-CM | POA: Diagnosis not present

## 2022-04-23 ENCOUNTER — Other Ambulatory Visit: Payer: Self-pay

## 2022-04-23 MED ORDER — VITAMIN D (ERGOCALCIFEROL) 1.25 MG (50000 UNIT) PO CAPS
50000.0000 [IU] | ORAL_CAPSULE | ORAL | 0 refills | Status: AC
  Filled 2022-04-23: qty 8, 56d supply, fill #0

## 2022-05-05 ENCOUNTER — Other Ambulatory Visit: Payer: Self-pay

## 2022-09-08 ENCOUNTER — Other Ambulatory Visit: Payer: Self-pay

## 2022-09-08 MED ORDER — SPIRONOLACTONE 50 MG PO TABS
50.0000 mg | ORAL_TABLET | Freq: Every day | ORAL | 8 refills | Status: AC
  Filled 2022-09-08: qty 90, 50d supply, fill #0

## 2023-02-26 IMAGING — MG MM DIGITAL SCREENING BILAT W/ TOMO AND CAD
6 of 12 series · 6 of 36 positions shown · non-contrast
Comparison: None.

CLINICAL DATA: Screening.

EXAM:
DIGITAL SCREENING BILATERAL MAMMOGRAM WITH TOMOSYNTHESIS AND CAD
TECHNIQUE: Bilateral screening digital craniocaudal and mediolateral oblique
mammograms were obtained. Bilateral screening digital breast
tomosynthesis was performed. The images were evaluated with
computer-aided detection.

[R CC synth-2D (1 of 2)]
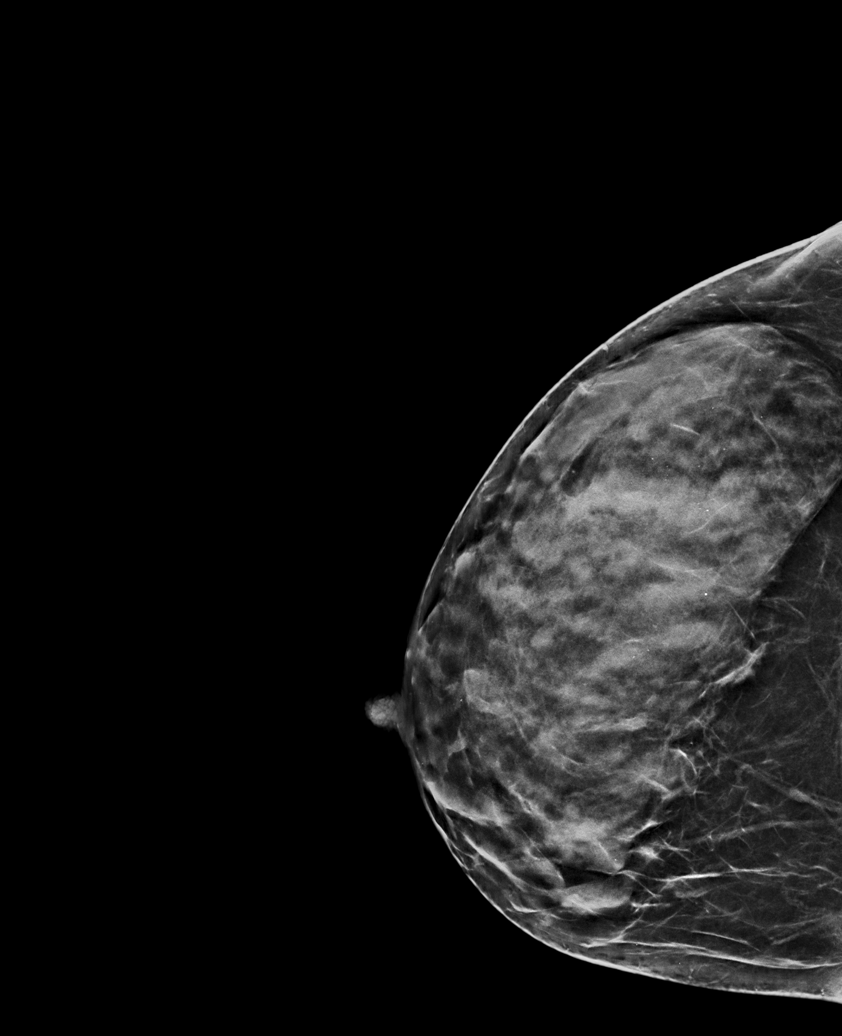

[R MLO synth-2D (1 of 2)]
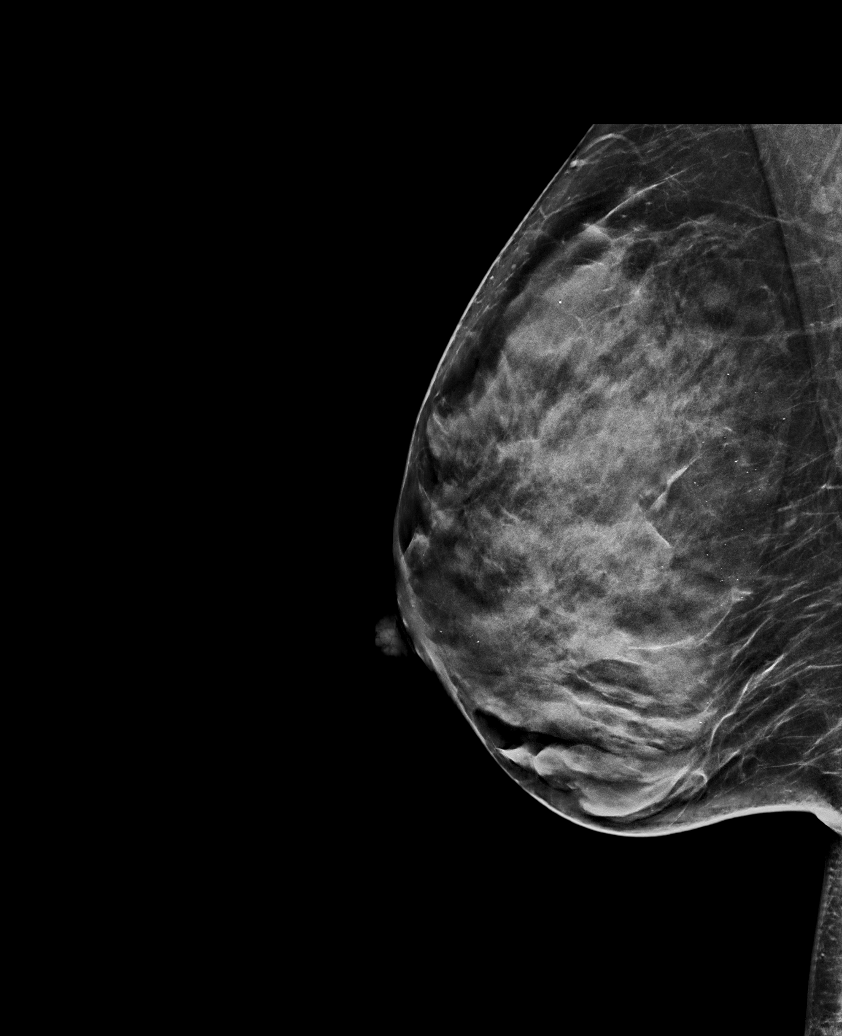

[R MLO synth-2D (2 of 2)]
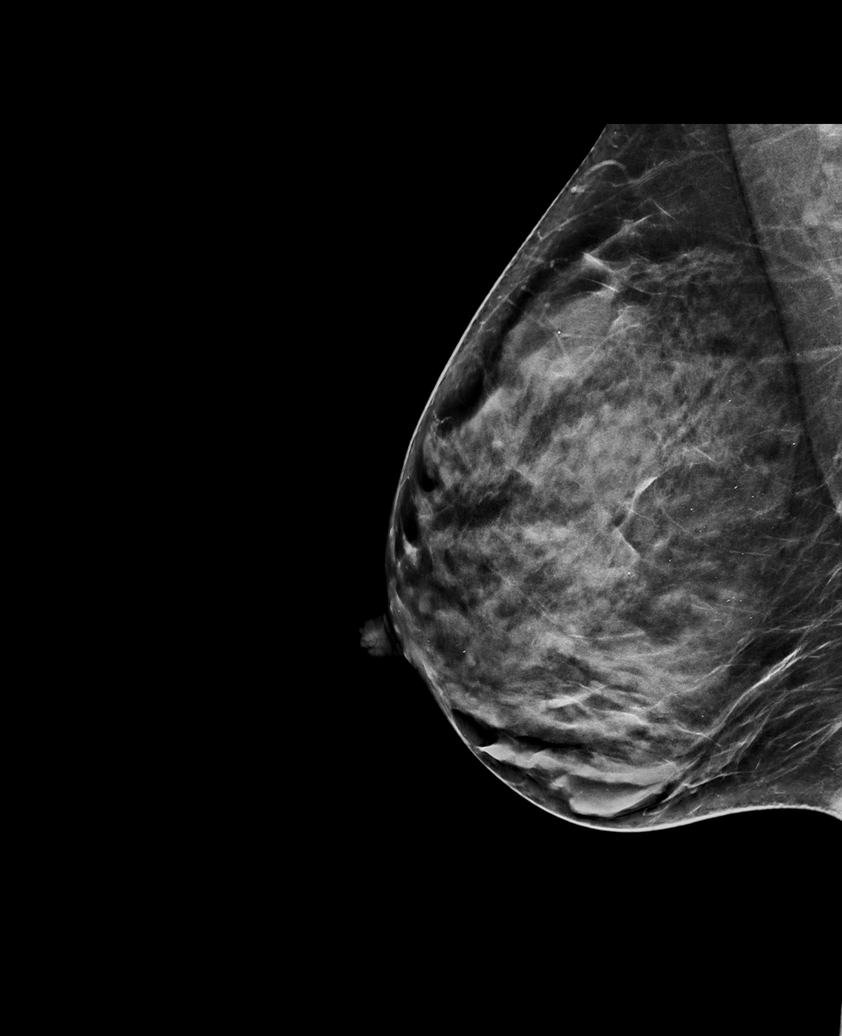

[L CC synth-2D]
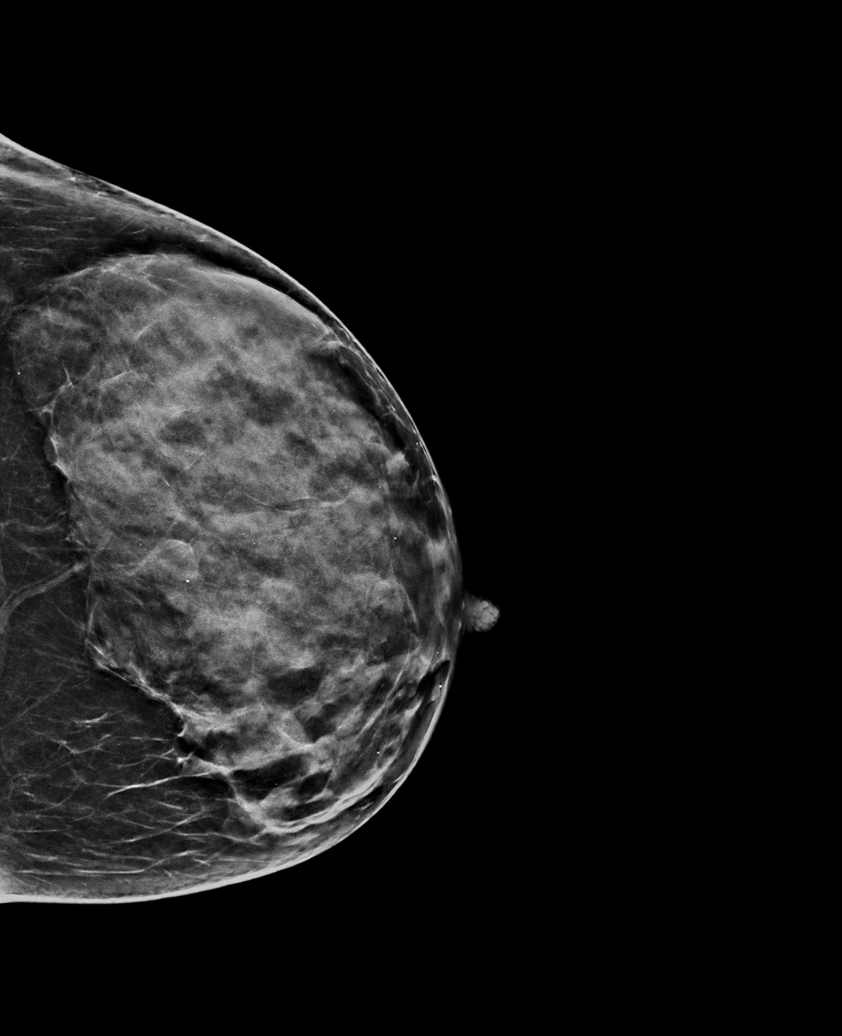

[L MLO synth-2D]
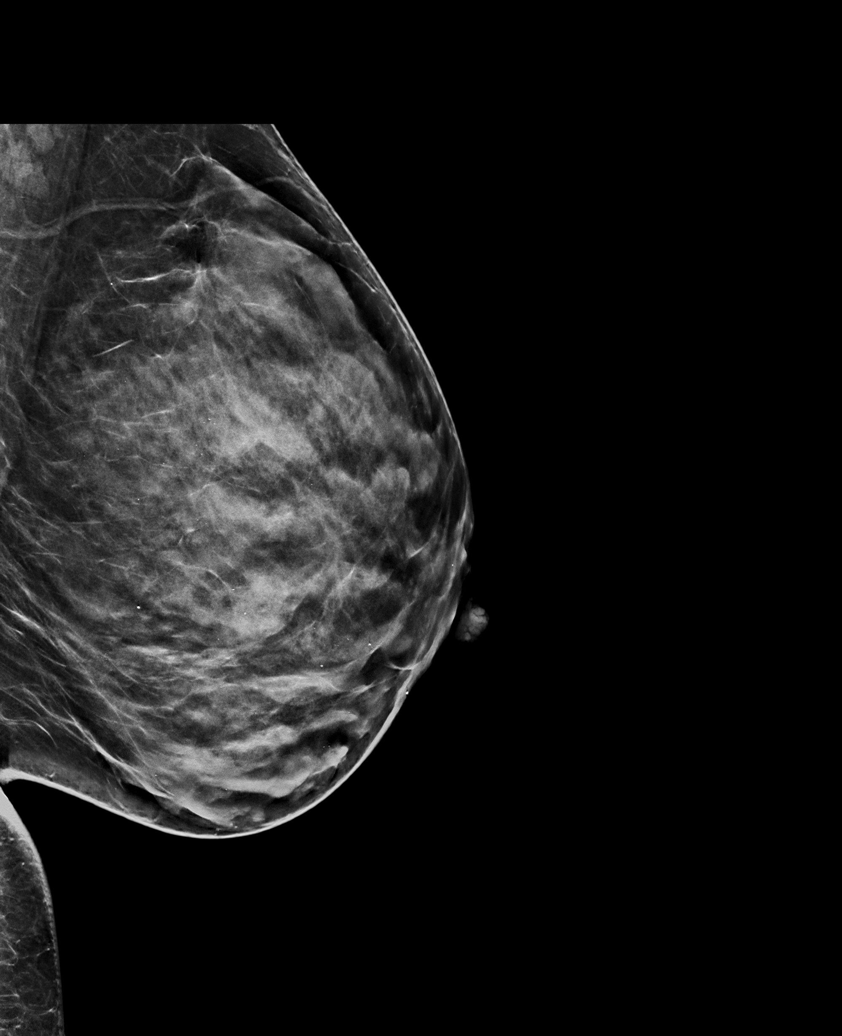

[R CC synth-2D (2 of 2)]
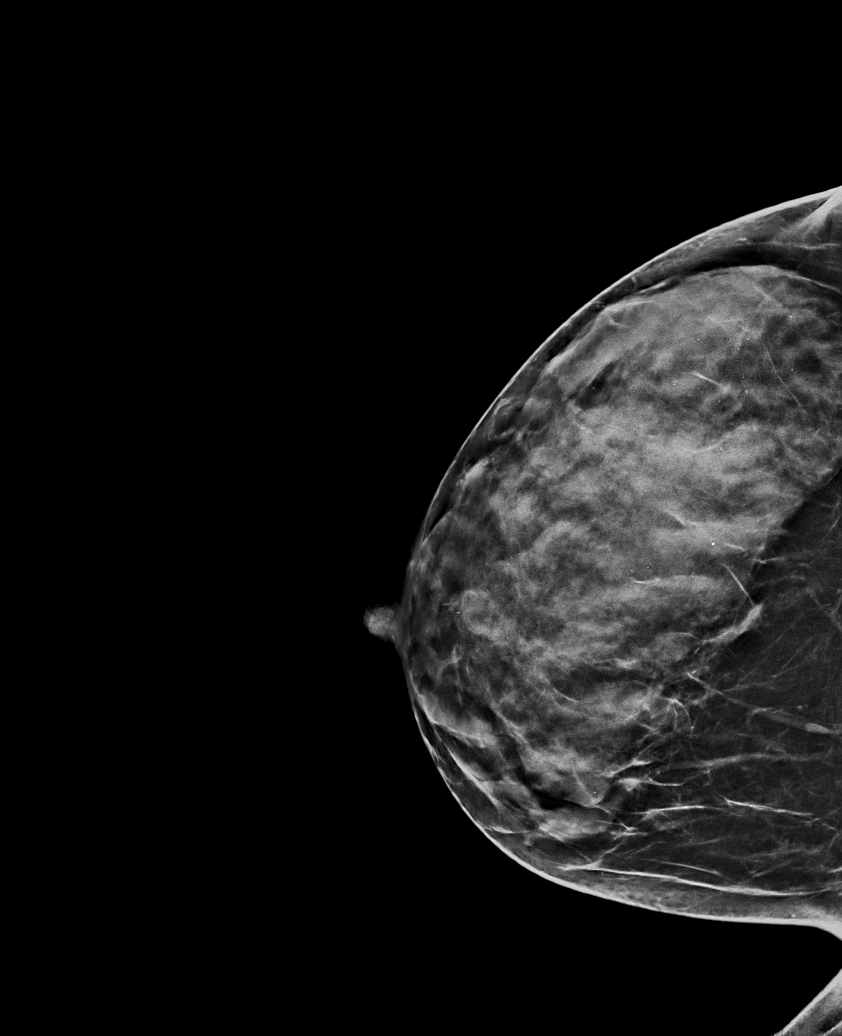

[6 of 36 positions shown; findings below may reference images not displayed]

ACR Breast Density Category c: The breast tissue is heterogeneously
dense, which may obscure small masses
FINDINGS: There are no findings suspicious for malignancy.
IMPRESSION: No mammographic evidence of malignancy. A result letter of this
screening mammogram will be mailed directly to the patient.

RECOMMENDATION:
Screening mammogram in one year. (Code:C8-T-HNK)

BI-RADS CATEGORY  1: Negative.

## 2023-03-01 ENCOUNTER — Encounter: Payer: Self-pay | Admitting: Pharmacist

## 2023-03-01 ENCOUNTER — Other Ambulatory Visit: Payer: Self-pay

## 2023-03-01 ENCOUNTER — Other Ambulatory Visit: Payer: Self-pay | Admitting: Obstetrics and Gynecology

## 2023-03-01 DIAGNOSIS — Z1231 Encounter for screening mammogram for malignant neoplasm of breast: Secondary | ICD-10-CM

## 2023-03-01 MED ORDER — NORETHINDRONE ACET-ETHINYL EST 1-20 MG-MCG PO TABS
1.0000 | ORAL_TABLET | Freq: Every day | ORAL | 4 refills | Status: AC
  Filled 2023-03-01: qty 84, 84d supply, fill #0

## 2023-03-01 MED ORDER — CLINDAMYCIN PHOS-BENZOYL PEROX 1.2-5 % EX GEL
CUTANEOUS | 0 refills | Status: AC
  Filled 2023-03-01: qty 45, 30d supply, fill #0

## 2023-03-01 MED ORDER — ZEPBOUND 2.5 MG/0.5ML ~~LOC~~ SOAJ
2.5000 mg | SUBCUTANEOUS | 1 refills | Status: AC
  Filled 2023-03-01 – 2023-03-02 (×2): qty 2, 28d supply, fill #0

## 2023-03-02 ENCOUNTER — Other Ambulatory Visit: Payer: Self-pay

## 2023-06-12 ENCOUNTER — Other Ambulatory Visit: Payer: Self-pay

## 2023-06-12 MED ORDER — TRAZODONE HCL 50 MG PO TABS
50.0000 mg | ORAL_TABLET | Freq: Every day | ORAL | 11 refills | Status: AC
  Filled 2023-06-12: qty 30, 30d supply, fill #0

## 2023-06-20 ENCOUNTER — Other Ambulatory Visit: Payer: Self-pay

## 2023-07-25 ENCOUNTER — Encounter (HOSPITAL_COMMUNITY): Payer: Self-pay

## 2023-08-01 ENCOUNTER — Encounter

## 2024-02-06 ENCOUNTER — Ambulatory Visit
Admission: RE | Admit: 2024-02-06 | Discharge: 2024-02-06 | Disposition: A | Discharge status: Home / Self Care | Source: Ambulatory Visit | Attending: Obstetrics and Gynecology | Admitting: Obstetrics and Gynecology

## 2024-02-06 DIAGNOSIS — Z1231 Encounter for screening mammogram for malignant neoplasm of breast: Secondary | ICD-10-CM | POA: Diagnosis present
